# Patient Record
Sex: Male | Born: 1955 | Race: White | Hispanic: No | Marital: Married | Smoking: Never smoker
Health system: Southern US, Community
[De-identification: ages and names within clinical notes are randomized; demographics above are authoritative.]

## PROBLEM LIST (undated history)

## (undated) DIAGNOSIS — E7800 Pure hypercholesterolemia, unspecified: Secondary | ICD-10-CM

## (undated) DIAGNOSIS — D333 Benign neoplasm of cranial nerves: Secondary | ICD-10-CM

## (undated) DIAGNOSIS — G43909 Migraine, unspecified, not intractable, without status migrainosus: Secondary | ICD-10-CM

## (undated) HISTORY — DX: Migraine, unspecified, not intractable, without status migrainosus: G43.909

## (undated) HISTORY — DX: Pure hypercholesterolemia, unspecified: E78.00

---
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
## 2003-12-21 HISTORY — PX: HERNIA REPAIR: SHX51

## 2004-06-08 ENCOUNTER — Ambulatory Visit (HOSPITAL_COMMUNITY): Admission: RE | Admit: 2004-06-08 | Discharge: 2004-06-08 | Payer: Self-pay | Admitting: General Surgery

## 2012-11-24 ENCOUNTER — Encounter: Payer: Self-pay | Admitting: Family Medicine

## 2012-12-15 ENCOUNTER — Encounter: Payer: Self-pay | Admitting: Family Medicine

## 2012-12-15 ENCOUNTER — Telehealth: Payer: Self-pay

## 2012-12-15 ENCOUNTER — Ambulatory Visit (INDEPENDENT_AMBULATORY_CARE_PROVIDER_SITE_OTHER): Payer: 59 | Admitting: Family Medicine

## 2012-12-15 VITALS — BP 100/68 | HR 60 | Temp 98.1°F | Resp 16 | Ht 69.0 in | Wt 168.9 lb

## 2012-12-15 DIAGNOSIS — Z23 Encounter for immunization: Secondary | ICD-10-CM

## 2012-12-15 DIAGNOSIS — N400 Enlarged prostate without lower urinary tract symptoms: Secondary | ICD-10-CM

## 2012-12-15 DIAGNOSIS — Z0000 Encounter for general adult medical examination without abnormal findings: Secondary | ICD-10-CM

## 2012-12-15 LAB — COMPREHENSIVE METABOLIC PANEL
ALT: 54 U/L — ABNORMAL HIGH (ref 0–53)
Albumin: 4.6 g/dL (ref 3.5–5.2)
Alkaline Phosphatase: 85 U/L (ref 39–117)
Glucose, Bld: 85 mg/dL (ref 70–99)
Total Protein: 7 g/dL (ref 6.0–8.3)

## 2012-12-15 LAB — CBC WITH DIFFERENTIAL/PLATELET
Basophils Relative: 1 % (ref 0–1)
Lymphocytes Relative: 30 % (ref 12–46)
MCH: 30 pg (ref 26.0–34.0)
MCHC: 34.6 g/dL (ref 30.0–36.0)
Monocytes Absolute: 0.4 10*3/uL (ref 0.1–1.0)
Monocytes Relative: 9 % (ref 3–12)
Neutro Abs: 2.3 10*3/uL (ref 1.7–7.7)
Platelets: 228 10*3/uL (ref 150–400)
RBC: 5.2 MIL/uL (ref 4.22–5.81)
RDW: 12.8 % (ref 11.5–15.5)
WBC: 4.1 10*3/uL (ref 4.0–10.5)

## 2012-12-15 LAB — POCT URINALYSIS DIPSTICK
Leukocytes, UA: NEGATIVE
Nitrite, UA: NEGATIVE

## 2012-12-15 LAB — LIPID PANEL
Cholesterol: 199 mg/dL (ref 0–200)
HDL: 51 mg/dL (ref >39)
Triglycerides: 155 mg/dL — ABNORMAL HIGH (ref <150)

## 2012-12-15 LAB — TSH: TSH: 0.994 u[IU]/mL (ref 0.350–4.500)

## 2012-12-15 NOTE — Progress Notes (Addendum)
Subjective:    Patient ID: Edward Medina, male    DOB: July 12, 1956, 56 y.o.   MRN: [id number]  HPI  Edward Medina is a delightful 56 yo man, whose wife is also a pt of mine. He presents today for a general check-up without any concerns.  He relates that he perceives his only medical problem to be a h/o mildly elevated cholesterol.  He was tried on a statin prev and took 1 pill and immed made him very confused - got lost while driving - so adamantly against trying statins again and wants to try to manage chol diet and exercise.  He is also taking red yeast rice supp.  His father passed from a massive MI in his mid-50s.  Might be needing surgery on Rt wrist so wants to make sure he is good shape for surgery in case he needs clearance.  Fasting today.  Sees urology regularly about every 6 mos for BPH and they follow his PSA. Had a perforated colon from a fish bone in 1997. Last colonoscopy 07/2010 with Dr. Loreta Ave so UTD. Past Medical History  Diagnosis Date  . Asthma     infant   Past Surgical History  Procedure Date  . Hernia repair 2005   History   Social History  . Marital Status: Married    Spouse Name: N/A    Number of Children: N/A  . Years of Education: N/A   Occupational History  . IT professional    Social History Main Topics  . Smoking status: Never Smoker   . Smokeless tobacco: Not on file  . Alcohol Use: Yes     Comment: drink - beer, wine, and hard liquor  . Drug Use: Not on file  . Sexually Active: Not on file     Comment: number of sex partners in the last 12 months - 1, birth control method  (pill)   Other Topics Concern  . Not on file   Social History Narrative   Exercise indoor roller hockey 1-2 times per week for 2 hours   Family History  Problem Relation Age of Onset  . Heart disease Father   . Stroke Maternal Grandmother   . Heart disease Maternal Grandfather    Current Outpatient Prescriptions on File Prior to Visit  Medication Sig Dispense Refill    . terazosin (HYTRIN) 5 MG capsule Take 5 mg by mouth at bedtime.       Allergies  Allergen Reactions  . Amoxicillin Rash    SEVERE    Review of Systems  Constitutional: Negative.   HENT: Positive for neck pain.   Eyes: Negative.   Respiratory: Negative.   Cardiovascular: Negative.   Gastrointestinal: Positive for constipation.  Genitourinary: Positive for difficulty urinating.  Musculoskeletal: Positive for arthralgias.       Scapholonate ligament tear-RH wrist  Skin: Negative.   Neurological: Negative.   Hematological: Negative.   Psychiatric/Behavioral: Negative.       BP 100/68  Pulse 60  Temp 98.1 F (36.7 C) (Oral)  Resp 16  Ht 5\' 9"  (1.753 m)  Wt 168 lb 14.4 oz (76.613 kg)  BMI 24.94 kg/m2  SpO2 100% Objective:   Physical Exam  Constitutional: He is oriented to person, place, and time. He appears well-developed and well-nourished. No distress.  HENT:  Head: Normocephalic and atraumatic.  Right Ear: Tympanic membrane, external ear and ear canal normal.  Left Ear: Tympanic membrane, external ear and ear canal normal.  Nose: Nose normal.  Mouth/Throat: Uvula is midline, oropharynx is clear and moist and mucous membranes are normal. No oropharyngeal exudate.  Eyes: Conjunctivae normal are normal. Right eye exhibits no discharge. Left eye exhibits no discharge. No scleral icterus.  Neck: Normal range of motion. Neck supple. No thyromegaly present.  Cardiovascular: Normal rate, regular rhythm, normal heart sounds and intact distal pulses.   Pulmonary/Chest: Effort normal and breath sounds normal. No respiratory distress.  Abdominal: Soft. Bowel sounds are normal. He exhibits no distension and no mass. There is no tenderness. There is no rebound and no guarding.  Musculoskeletal: He exhibits no edema.  Lymphadenopathy:    He has no cervical adenopathy.  Neurological: He is alert and oriented to person, place, and time. He has normal reflexes. No cranial nerve  deficit. He exhibits normal muscle tone.  Skin: Skin is warm and dry. No rash noted. He is not diaphoretic. No erythema.  Psychiatric: He has a normal mood and affect. His behavior is normal.      Results for orders placed in visit on 12/15/12  POCT URINALYSIS DIPSTICK      Component Value Range   Color, UA yellow     Clarity, UA clear     Glucose, UA neg     Bilirubin, UA neg     Ketones, UA neg     Spec Grav, UA 1.020     Blood, UA neg     pH, UA 6.0     Protein, UA neg     Urobilinogen, UA 0.2     Nitrite, UA neg     Leukocytes, UA Negative      Assessment & Plan:   1. Routine general medical examination at a health care facility  POCT urinalysis dipstick, Lipid panel - will need to check [place] when results return to assess lipid goal with risk factors of age and +FHx - advised mediterranean diet , Comprehensive metabolic panel, CBC with Differential, TSH, Last colonoscopy 07/2010 with Dr. Loreta Ave so UTD  2. Need for prophylactic vaccination and inoculation against influenza  Flu vaccine greater than or equal to 3yo preservative free IM  3. Benign prostatic hypertrophy  Followed by urology

## 2012-12-15 NOTE — Telephone Encounter (Signed)
PT WOULD LIKE TO LET DR.SHAW KNOW THAT THE DATE OF LAST HIS COLONOSCOPY WAS 08/13/10. BEST# [phone]

## 2012-12-18 ENCOUNTER — Encounter: Payer: Self-pay | Admitting: Family Medicine

## 2012-12-18 NOTE — Telephone Encounter (Signed)
Pt states that he would like to know his EKG results. Please advise, Best# [phone]

## 2012-12-19 NOTE — Telephone Encounter (Signed)
His EKG was normal.  I'd be happy to send him a copy of it but it was done on paper next door at [address] so has been sent off to be scanned in - but is not yet scanned in - could take several wks.  I cancelled the colonoscopy referral that I placed.  Also, because I had already signed off on his blood work before his my chart was created, I don't think I can release it earlier to the web than the automatic 10d - at least I can't figure out how to.  We'd be happy to mail him a copy prior if he would like.

## 2012-12-19 NOTE — Telephone Encounter (Signed)
Called pt and notified him of EKG results and cancellation of colonoscopy. Mailed pt a copy of his labs, but pt stated he does not need a copy of his EKG. Pt would like his chart to be updated w/his colonoscopy record if possible. Abstracted new date for last colonoscopy.

## 2012-12-19 NOTE — Addendum Note (Signed)
Addended by: [name] on: 12/19/2012 09:07 AM   Modules accepted: Orders

## 2013-02-06 ENCOUNTER — Encounter: Payer: Self-pay | Admitting: Family Medicine

## 2013-10-25 ENCOUNTER — Other Ambulatory Visit: Payer: Self-pay

## 2013-11-23 ENCOUNTER — Encounter: Payer: Self-pay | Admitting: Family Medicine

## 2013-11-23 ENCOUNTER — Ambulatory Visit (INDEPENDENT_AMBULATORY_CARE_PROVIDER_SITE_OTHER): Payer: 59 | Admitting: Family Medicine

## 2013-11-23 VITALS — BP 120/64 | HR 66 | Temp 97.6°F | Resp 16 | Ht 69.0 in | Wt 164.0 lb

## 2013-11-23 DIAGNOSIS — Z8249 Family history of ischemic heart disease and other diseases of the circulatory system: Secondary | ICD-10-CM

## 2013-11-23 DIAGNOSIS — M8580 Other specified disorders of bone density and structure, unspecified site: Secondary | ICD-10-CM

## 2013-11-23 DIAGNOSIS — Z0000 Encounter for general adult medical examination without abnormal findings: Secondary | ICD-10-CM

## 2013-11-23 DIAGNOSIS — N138 Other obstructive and reflux uropathy: Secondary | ICD-10-CM

## 2013-11-23 DIAGNOSIS — K5909 Other constipation: Secondary | ICD-10-CM

## 2013-11-23 LAB — CBC WITH DIFFERENTIAL/PLATELET
Basophils Relative: 1 % (ref 0–1)
Eosinophils Absolute: 0.3 10*3/uL (ref 0.0–0.7)
Eosinophils Relative: 5 % (ref 0–5)
Hemoglobin: 16.2 g/dL (ref 13.0–17.0)
Lymphocytes Relative: 27 % (ref 12–46)
Lymphs Abs: 1.5 10*3/uL (ref 0.7–4.0)
MCH: 30.1 pg (ref 26.0–34.0)
MCV: 84.2 fL (ref 78.0–100.0)
Neutro Abs: 3.1 10*3/uL (ref 1.7–7.7)
Platelets: 267 10*3/uL (ref 150–400)
WBC: 5.3 10*3/uL (ref 4.0–10.5)

## 2013-11-23 LAB — LIPID PANEL
Cholesterol: 203 mg/dL — ABNORMAL HIGH (ref 0–200)
HDL: 52 mg/dL (ref >39)
LDL Cholesterol: 133 mg/dL — ABNORMAL HIGH (ref 0–99)
Total CHOL/HDL Ratio: 3.9 Ratio
VLDL: 18 mg/dL (ref 0–40)

## 2013-11-23 LAB — COMPREHENSIVE METABOLIC PANEL
ALT: 77 U/L — ABNORMAL HIGH (ref 0–53)
Albumin: 4.6 g/dL (ref 3.5–5.2)
Alkaline Phosphatase: 134 U/L — ABNORMAL HIGH (ref 39–117)
BUN: 17 mg/dL (ref 6–23)
Calcium: 10 mg/dL (ref 8.4–10.5)
Creat: 1.01 mg/dL (ref 0.50–1.35)
Sodium: 140 mEq/L (ref 135–145)

## 2013-11-23 NOTE — Progress Notes (Signed)
   Subjective:    Patient ID: Edward Medina, male    DOB: [birth year], 57 y.o.   MRN: [id number]  HPI    Review of Systems  HENT: Positive for postnasal drip.   Gastrointestinal: Positive for constipation.  Genitourinary: Positive for difficulty urinating.       Objective:   Physical Exam        Assessment & Plan:

## 2013-11-23 NOTE — Patient Instructions (Signed)

## 2013-11-23 NOTE — Progress Notes (Signed)
Subjective:    Patient ID: Edward Medina, male    DOB: 1956-09-04, 57 y.o.   MRN: [id number] Chief Complaint  Patient presents with  . Annual Exam    HPI  Edward Medina is a delightful 57 yo man, whose wife is also a pt of mine. He presents today for a general check-up without any concerns. He relates that he perceives his only medical problem to be a h/o mildly elevated cholesterol. He was tried on a statin prev and took 1 pill and immed made him very confused - got lost while driving - so adamantly against trying statins again and wants to try to manage chol diet and exercise. He is also taking red yeast rice supp. His father passed from a massive MI in his mid-50s.   Might be needing surgery on Rt wrist so wants to make sure he is good shape for surgery in case he needs clearance. Saw Dr. Amanda Medina Jan 2013 - told when ready for surgery he would know it - will loose 40-50% motion in all directions so he is ok putting up the wrist, still playing roller hockey.  Fasting today.   Sees urology Edward Medina regularly about every 6 mos for BPH and they follow his PSA. Still managing w/ terazosin. Sxs always worst in the morning.  Does have some terazosin side effects so unsure about doubling dose.  Had a perforated colon from a fish bone in 1997. Last colonoscopy 07/2010 with Dr. Loreta Medina so UTD.  TDaP 2010. Got flu shot this year.  08/2011 bone density [facility]xa at [facility]. Takes multivitamin daily but no sig sources of calcium in the diet.  Has been having some chronic constipation. Drinks plenty of water, fiber, exercise. Occ uses Miralax which works well but slowly. Has used enemas in past as well as glycerin suppositories.  Has little stool balls, causes lower back pain.  Only needs to use drysol once every 60d to control symptoms.   Past Medical History  Diagnosis Date  . Asthma     infant   Past Surgical History  Procedure Laterality Date  . Hernia repair  2005   Current Outpatient Prescriptions  on File Prior to Visit  Medication Sig Dispense Refill  . aspirin 81 MG tablet Take 81 mg by mouth daily.      . fish oil-omega-3 fatty acids 1000 MG capsule Take 2 g by mouth daily.      . Multiple Vitamin (MULTI VITAMIN MENS PO) Take by mouth daily.      . Red Yeast Rice Extract (RED YEAST RICE PO) Take by mouth daily.      . Saw Palmetto, Serenoa repens, (SAW PALMETTO PO) Take by mouth daily.      Marland Kitchen terazosin (HYTRIN) 5 MG capsule Take 5 mg by mouth at bedtime.       No current facility-administered medications on file prior to visit.   Allergies  Allergen Reactions  . Amoxicillin Rash    SEVERE   Family History  Problem Relation Age of Onset  . Heart disease Father   . Stroke Maternal Grandmother   . Heart disease Maternal Grandfather    History   Social History  . Marital Status: Married    Spouse Name: N/A    Number of Children: N/A  . Years of Education: N/A   Occupational History  . IT professional    Social History Main Topics  . Smoking status: Never Smoker   . Smokeless tobacco: None  .  Alcohol Use: Yes     Comment: drink - beer, wine, and hard liquor  . Drug Use: None  . Sexual Activity: None     Comment: number of sex partners in the last 12 months - 1, birth control method  (pill)   Other Topics Concern  . None   Social History Narrative   Exercise indoor roller hockey 1-2 times per week for 2 hours    Review of Systems See above nursing note for complete list    BP 120/64  Pulse 66  Temp(Src) 97.6 F (36.4 C) (Oral)  Resp 16  Ht 5\' 9"  (1.753 m)  Wt 164 lb (74.39 kg)  BMI 24.21 kg/m2  SpO2 99% Objective:   Physical Exam  Constitutional: He is oriented to person, place, and time. He appears well-developed and well-nourished. No distress.  HENT:  Head: Normocephalic and atraumatic.  Right Ear: External ear and ear canal normal.  Left Ear: Tympanic membrane, external ear and ear canal normal.  Nose: Nose normal.  Mouth/Throat: Uvula is  midline, oropharynx is clear and moist and mucous membranes are normal. No oropharyngeal exudate.  Right ear w/ cerumen impaction  Eyes: Conjunctivae are normal. Right eye exhibits no discharge. Left eye exhibits no discharge. No scleral icterus.  Neck: Normal range of motion. Neck supple. No thyromegaly present.  Cardiovascular: Normal rate, regular rhythm, normal heart sounds and intact distal pulses.   Pulmonary/Chest: Effort normal and breath sounds normal. No respiratory distress.  Abdominal: Soft. Bowel sounds are normal. He exhibits no distension and no mass. There is no tenderness. There is no rebound and no guarding.  Musculoskeletal: He exhibits no edema.  Lymphadenopathy:    He has no cervical adenopathy.  Neurological: He is alert and oriented to person, place, and time. He has normal reflexes. No cranial nerve deficit. He exhibits normal muscle tone.  Skin: Skin is warm and dry. No rash noted. He is not diaphoretic. No erythema.  Psychiatric: He has a normal mood and affect. His behavior is normal.          Assessment & Plan:   Routine general medical examination at a health care facility - Plan: CBC with Differential, Comprehensive metabolic panel, Lipid panel, TSH  Osteopenia - Plan: TSH, Vit D  25 hydroxy (rtn osteoporosis monitoring)  Chronic constipation  Family history of coronary artery disease - Plan: Lipid panel  BPH (benign prostatic hypertrophy) with urinary obstruction  Meds ordered this encounter  Medications  . aluminum chloride (DRYSOL) 20 % external solution    Sig: Apply topically at bedtime.    Edward Sorenson, MD MPH

## 2013-11-24 LAB — VITAMIN D 25 HYDROXY (VIT D DEFICIENCY, FRACTURES): Vit D, 25-Hydroxy: 47 ng/mL (ref 30–89)

## 2014-01-25 ENCOUNTER — Telehealth: Payer: Self-pay | Admitting: Family Medicine

## 2014-01-25 NOTE — Telephone Encounter (Signed)
LMOM to CB. 

## 2014-01-25 NOTE — Telephone Encounter (Signed)
Message copied by Chinita Pester on Fri Jan 25, 2014  2:31 PM ------      Message from: Delman Cheadle      Created: Sat Jan 19, 2014  2:14 PM       Please have pt schedule a f/u appt in about 2 months to have elevated liver enzymes rechecked.  Thanks.      Dr. Brigitte Pulse ------

## 2014-01-31 ENCOUNTER — Telehealth: Payer: Self-pay | Admitting: Family Medicine

## 2014-01-31 NOTE — Telephone Encounter (Signed)
LMOM to CB. 

## 2014-01-31 NOTE — Telephone Encounter (Signed)
Message copied by Chinita Pester on Thu Jan 31, 2014 12:21 PM ------      Message from: [place], EVA      Created: Sat Jan 19, 2014  2:14 PM       Please have pt schedule a f/u appt in about 2 months to have elevated liver enzymes rechecked.  Thanks.      Dr. Brigitte Pulse ------

## 2014-03-07 ENCOUNTER — Encounter: Payer: Self-pay | Admitting: Family Medicine

## 2014-03-10 ENCOUNTER — Ambulatory Visit: Payer: 59

## 2014-03-10 ENCOUNTER — Ambulatory Visit: Payer: 59 | Admitting: Emergency Medicine

## 2014-03-10 VITALS — BP 120/72 | HR 70 | Temp 97.9°F | Resp 16 | Ht 68.75 in | Wt 165.0 lb

## 2014-03-10 DIAGNOSIS — S20219A Contusion of unspecified front wall of thorax, initial encounter: Secondary | ICD-10-CM

## 2014-03-10 NOTE — Progress Notes (Signed)
[facility] [address], [place] [address] [phone]  Date:  03/10/2014   Name:  Edward Medina   DOB:  05-19-1956   MRN:  [id number]  PCP:  Delman Cheadle, MD    Chief Complaint: Chest Pain   History of Present Illness:  Edward Medina is a 58 y.o. very pleasant male patient who presents with the following:  Injured Wednesday playing roller hockey and has pain in left lateral chest wall that is associated with moderate pain and is pleuritic in nature.  No hemoptysis. No shortness of breath.  No nausea or vomiting.  No hematuria.  No improvement with over the counter medications or other home remedies. Denies other complaint or health concern today.   Patient Active Problem List   Diagnosis Date Noted  . Osteopenia 11/23/2013  . Family history of coronary artery disease 11/23/2013  . Benign prostatic hypertrophy 12/15/2012    Past Medical History  Diagnosis Date  . Asthma     infant  . Hypoglycemia     Past Surgical History  Procedure Laterality Date  . Hernia repair  2005    History  Substance Use Topics  . Smoking status: Never Smoker   . Smokeless tobacco: Not on file  . Alcohol Use: Yes     Comment: drink - beer, wine, and hard liquor    Family History  Problem Relation Age of Onset  . Heart disease Father   . Hyperlipidemia Father   . Stroke Maternal Grandmother   . Heart disease Maternal Grandfather     Allergies  Allergen Reactions  . Amoxicillin Rash    SEVERE    Medication list has been reviewed and updated.  Current Outpatient Prescriptions on File Prior to Visit  Medication Sig Dispense Refill  . aluminum chloride (DRYSOL) 20 % external solution Apply topically at bedtime.      Marland Kitchen aspirin 81 MG tablet Take 81 mg by mouth daily.      . fish oil-omega-3 fatty acids 1000 MG capsule Take 2 g by mouth daily.      . Multiple Vitamin (MULTI VITAMIN MENS PO) Take by mouth daily.      . Red Yeast Rice Extract (RED YEAST RICE  PO) Take by mouth daily.      . Saw Palmetto, Serenoa repens, (SAW PALMETTO PO) Take by mouth daily.      Marland Kitchen terazosin (HYTRIN) 5 MG capsule Take 5 mg by mouth at bedtime.       No current facility-administered medications on file prior to visit.    Review of Systems:  As per HPI, otherwise negative.    Physical Examination: Filed Vitals:   03/10/14 1417  BP: 120/72  Pulse: 70  Temp: 97.9 F (36.6 C)  Resp: 16   Filed Vitals:   03/10/14 1417  Height: 5' 8.75" (1.746 m)  Weight: 165 lb (74.844 kg)   Body mass index is 24.55 kg/(m^2). Ideal Body Weight: Weight in (lb) to have BMI = 25: 167.7  GEN: WDWN, NAD, Non-toxic, A & O x 3 HEENT: Atraumatic, Normocephalic. Neck supple. No masses, No LAD. Ears and Nose: No external deformity. CV: RRR, No M/G/R. No JVD. No thrill. No extra heart sounds. Chest:  Tender left mid chest wall PULM: CTA B, no wheezes, crackles, rhonchi. No retractions. No resp. distress. No accessory muscle use. ABD: S, NT, ND, +BS. No rebound. No HSM. EXTR: No c/c/e NEURO Normal gait.  PSYCH: Normally interactive. Conversant.  Not depressed or anxious appearing.  Calm demeanor.    Assessment and Plan: Chest contusion Declined pain meds says OTC NSAID are ok   Signed,  Ellison Carwin, MD   [facility] reading (PRIMARY) by  Dr. Ouida Sills.  Negative chest.

## 2014-03-10 NOTE — Patient Instructions (Signed)
Rib Fracture  A rib fracture is a break or crack in one of the bones of the ribs. The ribs are a group of long, curved bones that wrap around your chest and attach to your spine. They protect your lungs and other organs in the chest cavity. A broken or cracked rib is often painful, but most do not cause other problems. Most rib fractures heal on their own over time. However, rib fractures can be more serious if multiple ribs are broken or if broken ribs move out of place and push against other structures.  CAUSES   · A direct blow to the chest. For example, this could happen during contact sports, a car accident, or a fall against a hard object.  · Repetitive movements with high force, such as pitching a baseball or having severe coughing spells.  SYMPTOMS   · Pain when you breathe in or cough.  · Pain when someone presses on the injured area.  DIAGNOSIS   Your caregiver will perform a physical exam. Various imaging tests may be ordered to confirm the diagnosis and to look for related injuries. These tests may include a chest X-ray, computed tomography (CT), magnetic resonance imaging (MRI), or a bone scan.  TREATMENT   Rib fractures usually heal on their own in 1 3 months. The longer healing period is often associated with a continued cough or other aggravating activities. During the healing period, pain control is very important. Medication is usually given to control pain. Hospitalization or surgery may be needed for more severe injuries, such as those in which multiple ribs are broken or the ribs have moved out of place.   HOME CARE INSTRUCTIONS   · Avoid strenuous activity and any activities or movements that cause pain. Be careful during activities and avoid bumping the injured rib.  · Gradually increase activity as directed by your caregiver.  · Only take over-the-counter or prescription medications as directed by your caregiver. Do not take other medications without asking your caregiver first.  · Apply ice  to the injured area for the first 1 2 days after you have been treated or as directed by your caregiver. Applying ice helps to reduce inflammation and pain.  · Put ice in a plastic bag.  · Place a towel between your skin and the bag.    · Leave the ice on for 15 20 minutes at a time, every 2 hours while you are awake.  · Perform deep breathing as directed by your caregiver. This will help prevent pneumonia, which is a common complication of a broken rib. Your caregiver may instruct you to:  · Take deep breaths several times a day.  · Try to cough several times a day, holding a pillow against the injured area.  · Use a device called an incentive spirometer to practice deep breathing several times a day.  · Drink enough fluids to keep your urine clear or pale yellow. This will help you avoid constipation.    · Do not wear a rib belt or binder. These restrict breathing, which can lead to pneumonia.    SEEK IMMEDIATE MEDICAL CARE IF:   · You have a fever.    · You have difficulty breathing or shortness of breath.    · You develop a continual cough, or you cough up thick or bloody sputum.  · You feel sick to your stomach (nausea), throw up (vomit), or have abdominal pain.    · You have worsening pain not controlled with medications.      MAKE SURE YOU:  · Understand these instructions.  · Will watch your condition.  · Will get help right away if you are not doing well or get worse.  Document Released: 12/06/2005 Document Revised: 08/08/2013 Document Reviewed: 02/07/2013  [employer]® Patient Information ©2014 [employer].

## 2014-10-18 ENCOUNTER — Encounter: Payer: Self-pay | Admitting: Family Medicine

## 2014-10-18 ENCOUNTER — Ambulatory Visit (INDEPENDENT_AMBULATORY_CARE_PROVIDER_SITE_OTHER): Payer: 59 | Admitting: Family Medicine

## 2014-10-18 VITALS — BP 102/66 | HR 68 | Temp 98.3°F | Resp 16 | Ht 68.5 in | Wt 166.2 lb

## 2014-10-18 DIAGNOSIS — Z23 Encounter for immunization: Secondary | ICD-10-CM

## 2014-10-18 DIAGNOSIS — M8580 Other specified disorders of bone density and structure, unspecified site: Secondary | ICD-10-CM

## 2014-10-18 DIAGNOSIS — Z13220 Encounter for screening for lipoid disorders: Secondary | ICD-10-CM

## 2014-10-18 DIAGNOSIS — Z0000 Encounter for general adult medical examination without abnormal findings: Secondary | ICD-10-CM

## 2014-10-18 DIAGNOSIS — Z8249 Family history of ischemic heart disease and other diseases of the circulatory system: Secondary | ICD-10-CM

## 2014-10-18 DIAGNOSIS — E785 Hyperlipidemia, unspecified: Secondary | ICD-10-CM

## 2014-10-18 LAB — CBC WITH DIFFERENTIAL/PLATELET
BASOS ABS: 0 10*3/uL (ref 0.0–0.1)
BASOS PCT: 1 % (ref 0–1)
EOS ABS: 0.2 10*3/uL (ref 0.0–0.7)
EOS PCT: 5 % (ref 0–5)
HEMATOCRIT: 45.3 % (ref 39.0–52.0)
HEMOGLOBIN: 15.8 g/dL (ref 13.0–17.0)
LYMPHS ABS: 1.3 10*3/uL (ref 0.7–4.0)
Lymphocytes Relative: 33 % (ref 12–46)
MCH: 29.8 pg (ref 26.0–34.0)
MCHC: 34.9 g/dL (ref 30.0–36.0)
MCV: 85.5 fL (ref 78.0–100.0)
MONO ABS: 0.3 10*3/uL (ref 0.1–1.0)
MONOS PCT: 8 % (ref 3–12)
Neutro Abs: 2.1 10*3/uL (ref 1.7–7.7)
Neutrophils Relative %: 53 % (ref 43–77)
Platelets: 173 10*3/uL (ref 150–400)
RBC: 5.3 MIL/uL (ref 4.22–5.81)
RDW: 13.6 % (ref 11.5–15.5)
WBC: 3.9 10*3/uL — AB (ref 4.0–10.5)

## 2014-10-18 NOTE — Progress Notes (Signed)
Subjective:    Patient ID: Edward Medina, male    DOB: [birth year], 58 y.o.   MRN: [id number] This chart was scribed for Delman Cheadle, MD by Marti Sleigh, Medical Scribe. This patient was seen in Room 23 and the patient's care was started a 4:16 PM.  Chief Complaint  Patient presents with  . Annual Exam    HPI Past Medical History  Diagnosis Date  . Asthma     infant  . Hypoglycemia    Allergies  Allergen Reactions  . Amoxicillin Rash    SEVERE   Current Outpatient Prescriptions on File Prior to Visit  Medication Sig Dispense Refill  . aluminum chloride (DRYSOL) 20 % external solution Apply topically at bedtime.      Marland Kitchen aspirin 81 MG tablet Take 81 mg by mouth daily.      . fish oil-omega-3 fatty acids 1000 MG capsule Take 2 g by mouth daily.      . Multiple Vitamin (MULTI VITAMIN MENS PO) Take by mouth daily.      . Red Yeast Rice Extract (RED YEAST RICE PO) Take by mouth daily.      . Saw Palmetto, Serenoa repens, (SAW PALMETTO PO) Take by mouth daily.      Marland Kitchen terazosin (HYTRIN) 5 MG capsule Take 5 mg by mouth at bedtime.       No current facility-administered medications on file prior to visit.   HPI Comments: ISAIR INABINET is a 58 y.o. male with a past hx of constipation and BPH who presents to [facility] for an annual exam. 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 Pt's father and uncle both had HLD and MIs at 57 years old. Pt's father passed away at 65 from CAD.  Pt has had progressively increasing LFT's that are presumed to be a result of fatty liver, but no liver imaging has been done prior. Pt is intolerant of statins, but pt manages his cholesterol with omega 3s and exercising regulary.   Pt sees his urologist for his BPH, who also follows his PSA.  Pt had a dexa bone scan done at [facility] on 08/2011, which showed osteopenia.  Pt had a colonoscopy 2011, performed by Dr. Collene Mares.  Pt had a T-dap done in 2010.   Past Surgical History  Procedure Laterality  Date  . Hernia repair  2005   Family History  Problem Relation Age of Onset  . Heart disease Father   . Hyperlipidemia Father   . Stroke Maternal Grandmother   . Heart disease Maternal Grandfather    History   Social History  . Marital Status: Married    Spouse Name: N/A    Number of Children: N/A  . Years of Education: N/A   Occupational History  . IT professional    Social History Main Topics  . Smoking status: Never Smoker   . Smokeless tobacco: None  . Alcohol Use: Yes     Comment: drink - beer, wine, and hard liquor  . Drug Use: None  . Sexual Activity: None     Comment: number of sex partners in the last 81 months - 74, birth control method  (pill)   Other Topics Concern  . None   Social History Narrative   Exercise indoor roller hockey 1-2 times per week for 2 hours   College   Married   Physicist, medical           Review of Systems  Constitutional: Negative for fever and chills.  Respiratory: Negative for shortness of breath.   Cardiovascular: Negative for chest pain and palpitations.  Gastrointestinal: Positive for constipation. Negative for nausea and vomiting.  Genitourinary: Positive for difficulty urinating.  Musculoskeletal: Positive for arthralgias. Negative for myalgias.  All other systems reviewed and are negative.      Objective:  BP 102/66  Pulse 68  Temp(Src) 98.3 F (36.8 C) (Oral)  Resp 16  Ht 5' 8.5" (1.74 m)  Wt 166 lb 3.2 oz (75.388 kg)  BMI 24.90 kg/m2  SpO2 94%  Physical Exam  Nursing note and vitals reviewed. Constitutional: He is oriented to person, place, and time. He appears well-developed and well-nourished.  HENT:  Head: Normocephalic and atraumatic.  Left Ear: External ear normal.  Right ear significantly blocked by cerumen.  Eyes: Pupils are equal, round, and reactive to light.  Neck: No JVD present. No thyromegaly present.  Cardiovascular: Normal rate, regular rhythm and normal heart sounds.   No murmur  heard. Pulmonary/Chest: Effort normal and breath sounds normal. No respiratory distress. He has no wheezes.  Abdominal: Soft. Bowel sounds are normal. He exhibits no mass. There is no tenderness.  Lymphadenopathy:    He has no cervical adenopathy.  Neurological: He is alert and oriented to person, place, and time.  Skin: Skin is warm and dry.  Psychiatric: He has a normal mood and affect. His behavior is normal.       Assessment & Plan:  Need for prophylactic vaccination and inoculation against influenza - Plan: Flu Vaccine QUAD 36+ mos IM  Osteopenia - last DEXA 3 yrs prev - rec repeat exam for recheck at [facility]  Routine general medical examination at a health care facility - Plan: COMPLETE METABOLIC PANEL WITH GFR, CBC with Differential, Lipid panel   Elev LFTs - LFTs normal this yr as cholesterol improved - very reassuring, cont monitoring yearly.  Screening for cholesterol level - Plan: Lipid panel Due to + FHx of early CAD in father, will refer pt to cardiology for stress test. Also, pt would like to avoid statin unless it would help significantly so rec pt get compete detailed lipid panel prior to cardiology appt so he can review results at that time.  Correct tube for this was not drawn this morning when pt was here for his fasting labs so he will RTC for lab only draw in future  I personally performed the services described in this documentation, which was scribed in my presence. The recorded information has been reviewed and considered, and addended by me as needed.  Delman Cheadle, MD MPH

## 2014-10-18 NOTE — Progress Notes (Deleted)
   Subjective:    Patient ID: Edward Medina, male    DOB: 03-Mar-1956, 58 y.o.   MRN: [id number]  HPI    Review of Systems  Constitutional: Negative.   HENT: Negative.   Eyes: Negative.   Respiratory: Negative.   Cardiovascular: Negative.   Gastrointestinal: Positive for constipation.  Endocrine: Negative.   Genitourinary: Positive for difficulty urinating.  Musculoskeletal: Positive for arthralgias.  Skin: Negative.   Allergic/Immunologic: Negative.   Neurological: Negative.   Hematological: Negative.   Psychiatric/Behavioral: Negative.        Objective:   Physical Exam        Assessment & Plan:

## 2014-10-19 LAB — COMPLETE METABOLIC PANEL WITH GFR
ALT: 40 U/L (ref 0–53)
AST: 28 U/L (ref 0–37)
Albumin: 4.2 g/dL (ref 3.5–5.2)
Alkaline Phosphatase: 63 U/L (ref 39–117)
BUN: 17 mg/dL (ref 6–23)
CHLORIDE: 103 meq/L (ref 96–112)
CO2: 28 meq/L (ref 19–32)
CREATININE: 0.85 mg/dL (ref 0.50–1.35)
Calcium: 9.6 mg/dL (ref 8.4–10.5)
GLUCOSE: 85 mg/dL (ref 70–99)
Potassium: 4.3 mEq/L (ref 3.5–5.3)
SODIUM: 139 meq/L (ref 135–145)
Total Bilirubin: 0.9 mg/dL (ref 0.2–1.2)
Total Protein: 6.8 g/dL (ref 6.0–8.3)

## 2014-10-19 LAB — LIPID PANEL
Cholesterol: 183 mg/dL (ref 0–200)
HDL: 53 mg/dL (ref >39)
LDL Cholesterol: 114 mg/dL — ABNORMAL HIGH (ref 0–99)
Total CHOL/HDL Ratio: 3.5 Ratio
Triglycerides: 78 mg/dL (ref <150)
VLDL: 16 mg/dL (ref 0–40)

## 2014-10-29 ENCOUNTER — Encounter: Payer: Self-pay | Admitting: Family Medicine

## 2014-11-28 ENCOUNTER — Ambulatory Visit (INDEPENDENT_AMBULATORY_CARE_PROVIDER_SITE_OTHER): Payer: 59 | Admitting: Cardiology

## 2014-11-28 ENCOUNTER — Encounter: Payer: Self-pay | Admitting: Cardiology

## 2014-11-28 VITALS — BP 138/72 | HR 64 | Ht 68.5 in | Wt 170.2 lb

## 2014-11-28 DIAGNOSIS — E785 Hyperlipidemia, unspecified: Secondary | ICD-10-CM

## 2014-11-28 DIAGNOSIS — Z8249 Family history of ischemic heart disease and other diseases of the circulatory system: Secondary | ICD-10-CM

## 2014-11-28 DIAGNOSIS — R0609 Other forms of dyspnea: Secondary | ICD-10-CM

## 2014-11-28 DIAGNOSIS — R0600 Dyspnea, unspecified: Secondary | ICD-10-CM

## 2014-11-28 NOTE — Patient Instructions (Addendum)
**Note De-Identified Baleria Wyman Obfuscation** Your physician recommends that you continue on your current medications as directed. Please refer to the Current Medication list given to you today.  Your physician has requested that you have an exercise tolerance test. For further information please visit [email or web address]. Please also follow instruction sheet, as given.  Dr Meda Coffee recommends that you have a calcium score, we will arrange to be done by her on same day as Treadmill.  Your physician wants you to follow-up in: 1 year. You will receive a reminder letter in the mail two months in advance. If you don't receive a letter, please call our office to schedule the follow-up appointment.

## 2014-11-28 NOTE — Progress Notes (Signed)
Patient ID: RAN TULLIS, male   DOB: 05-13-1956, 58 y.o.   MRN: [id number]    Patient Name: Edward Medina Date of Encounter: 11/28/2014  Primary Care Provider:  Delman Cheadle, MD Primary Cardiologist: Dorothy Spark   Problem List   Past Medical History  Diagnosis Date  . Asthma     infant  . Hypoglycemia    Past Surgical History  Procedure Laterality Date  . Hernia repair  2005    Allergies  Allergies  Allergen Reactions  . Amoxicillin Rash    SEVERE    HPI  58 year old gentleman with past medical history of hyperlipidemia and family history of premature coronary artery disease. Patient's main concern is that his father died of massive heart attack at age of 56. The patient himself was diagnosed with hyperlipidemia few years ago and for a while taking simvastatin that lead to confusion where he lost his way on her way to work. He is currently taking Fish all and red yeast rice without any side effects. The patient used to have active job however now he works from home and states that his activity level has significantly decreased. He plays floor hockey every Wednesday. He would occasionally feel short of breath especially after a long period of inactivity. He denies any palpitations or syncope. No chest pain, lower extremity edema, claudications or orthopnea. He sticks to healthy diet most of the time.  Home Medications  Prior to Admission medications   Medication Sig Start Date End Date Taking? Authorizing Provider  aluminum chloride (DRYSOL) 20 % external solution Apply topically at bedtime.   Yes Historical Provider, MD  aspirin 81 MG tablet Take 81 mg by mouth daily.   Yes Historical Provider, MD  fish oil-omega-3 fatty acids 1000 MG capsule Take 2 g by mouth 3 (three) times a week.    Yes Historical Provider, MD  Multiple Vitamin (MULTI VITAMIN MENS PO) Take by mouth daily.   Yes Historical Provider, MD  Red Yeast Rice Extract (RED YEAST RICE PO) Take by mouth  daily.   Yes Historical Provider, MD  Saw Palmetto, Serenoa repens, (SAW PALMETTO PO) Take by mouth daily.   Yes Historical Provider, MD  terazosin (HYTRIN) 5 MG capsule Take 5 mg by mouth at bedtime.   Yes Historical Provider, MD    Family History  Family History  Problem Relation Age of Onset  . Heart disease Father   . Hyperlipidemia Father   . Stroke Maternal Grandmother   . Heart disease Maternal Grandfather     Social History  History   Social History  . Marital Status: Married    Spouse Name: N/A    Number of Children: N/A  . Years of Education: N/A   Occupational History  . IT professional    Social History Main Topics  . Smoking status: Never Smoker   . Smokeless tobacco: Not on file  . Alcohol Use: Yes     Comment: drink - beer, wine, and hard liquor  . Drug Use: Not on file  . Sexual Activity: Not on file     Comment: number of sex partners in the last 43 months - 51, birth control method  (pill)   Other Topics Concern  . Not on file   Social History Narrative   Exercise indoor roller hockey 1-2 times per week for 2 hours   College   Married   Physicist, medical           Review  of Systems, as per HPI, otherwise negative General:  No chills, fever, night sweats or weight changes.  Cardiovascular:  No chest pain, dyspnea on exertion, edema, orthopnea, palpitations, paroxysmal nocturnal dyspnea. Dermatological: No rash, lesions/masses Respiratory: No cough, dyspnea Urologic: No hematuria, dysuria Abdominal:   No nausea, vomiting, diarrhea, bright red blood per rectum, melena, or hematemesis Neurologic:  No visual changes, wkns, changes in mental status. All other systems reviewed and are otherwise negative except as noted above.  Physical Exam  Blood pressure 138/72, pulse 64, height 5' 8.5" (1.74 m), weight 170 lb 3.2 oz (77.202 kg).  General: Pleasant, NAD Psych: Normal affect. Neuro: Alert and oriented X 3. Moves all extremities  spontaneously. HEENT: Normal  Neck: Supple without bruits or JVD. Lungs:  Resp regular and unlabored, CTA. Heart: RRR no s3, s4, or murmurs. Abdomen: Soft, non-tender, non-distended, BS + x 4.  Extremities: No clubbing, cyanosis or edema. DP/PT/Radials 2+ and equal bilaterally.  Labs:  No results for input(s): CKTOTAL, CKMB, TROPONINI in the last 72 hours. Lab Results  Component Value Date   WBC 3.9* 10/18/2014   HGB 15.8 10/18/2014   HCT 45.3 10/18/2014   MCV 85.5 10/18/2014   PLT 173 10/18/2014    No results found for: DDIMER Invalid input(s): POCBNP    Component Value Date/Time   NA 139 10/18/2014 1648   K 4.3 10/18/2014 1648   CL 103 10/18/2014 1648   CO2 28 10/18/2014 1648   GLUCOSE 85 10/18/2014 1648   BUN 17 10/18/2014 1648   CREATININE 0.85 10/18/2014 1648   CALCIUM 9.6 10/18/2014 1648   PROT 6.8 10/18/2014 1648   ALBUMIN 4.2 10/18/2014 1648   AST 28 10/18/2014 1648   ALT 40 10/18/2014 1648   ALKPHOS 63 10/18/2014 1648   BILITOT 0.9 10/18/2014 1648   GFRNONAA >89 10/18/2014 1648   GFRAA >89 10/18/2014 1648   Lab Results  Component Value Date   CHOL 183 10/18/2014   HDL 53 10/18/2014   LDLCALC 114* 10/18/2014   TRIG 78 10/18/2014   Accessory Clinical Findings  Echocardiogram - none  ECG - NSR, normal ECG.   Assessment & Plan  A very pleasant 58 year old gentleman  1. Preventive cardiology with significant FH of premature CAD, we will schedule a treadmill exercise stress test to evaluate his exercise capacity and any ischemic changes. As he is not a good candidate for statin therapy with significant side effects in the past we will order a calcium score for risk stratification for ischemic heart disease. Patient has to call us if he wants to go forward with the calcium score.  2. Blood pressure - well controlled  3. Hyperlipidemia - HDL and triglycerides at goal, LDL 114, ideally we would like it under 100 considering he has one risk factor we will  restudy file with calcium score and see if we need to potentially try another statin or stop using lipid therapy altogether.  Follow up in 1 year    Dorothy Spark, MD, [facility] 11/28/2014, 11:47 AM

## 2014-11-29 ENCOUNTER — Encounter: Payer: 59 | Admitting: Family Medicine

## 2014-12-09 ENCOUNTER — Ambulatory Visit (INDEPENDENT_AMBULATORY_CARE_PROVIDER_SITE_OTHER)
Admission: RE | Admit: 2014-12-09 | Discharge: 2014-12-09 | Disposition: A | Discharge status: Home / Self Care | Payer: 59 | Source: Ambulatory Visit | Attending: Cardiology | Admitting: Cardiology

## 2014-12-09 DIAGNOSIS — Z8249 Family history of ischemic heart disease and other diseases of the circulatory system: Secondary | ICD-10-CM

## 2015-01-01 ENCOUNTER — Encounter: Payer: Self-pay | Admitting: Family Medicine

## 2015-01-01 DIAGNOSIS — E785 Hyperlipidemia, unspecified: Secondary | ICD-10-CM | POA: Insufficient documentation

## 2015-01-08 ENCOUNTER — Other Ambulatory Visit (HOSPITAL_COMMUNITY): Payer: Self-pay | Admitting: Urology

## 2015-01-08 DIAGNOSIS — R972 Elevated prostate specific antigen [PSA]: Secondary | ICD-10-CM

## 2015-01-10 ENCOUNTER — Encounter: Payer: 59 | Admitting: Nurse Practitioner

## 2015-01-20 ENCOUNTER — Encounter: Payer: Self-pay | Admitting: Family Medicine

## 2015-01-24 ENCOUNTER — Other Ambulatory Visit: Payer: Self-pay | Admitting: *Deleted

## 2015-01-24 DIAGNOSIS — Z8249 Family history of ischemic heart disease and other diseases of the circulatory system: Secondary | ICD-10-CM

## 2015-01-24 DIAGNOSIS — R0609 Other forms of dyspnea: Secondary | ICD-10-CM

## 2015-01-28 ENCOUNTER — Encounter: Payer: Self-pay | Admitting: Nurse Practitioner

## 2015-01-28 ENCOUNTER — Ambulatory Visit (INDEPENDENT_AMBULATORY_CARE_PROVIDER_SITE_OTHER): Payer: 59 | Admitting: Nurse Practitioner

## 2015-01-28 VITALS — BP 113/79 | HR 85

## 2015-01-28 DIAGNOSIS — R0609 Other forms of dyspnea: Secondary | ICD-10-CM

## 2015-01-28 DIAGNOSIS — R9439 Abnormal result of other cardiovascular function study: Secondary | ICD-10-CM

## 2015-01-28 DIAGNOSIS — Z8249 Family history of ischemic heart disease and other diseases of the circulatory system: Secondary | ICD-10-CM

## 2015-01-28 NOTE — Patient Instructions (Signed)
We will arrange for a stress Myoview

## 2015-01-28 NOTE — Progress Notes (Signed)
Exercise Treadmill Test  Pre-Exercise Testing Evaluation Rhythm: normal sinus  Rate: 75     Test  Exercise Tolerance Test Ordering MD: Ena Dawley, MD  Interpreting MD: Truitt Merle, NP  Unique Test No: 1  Treadmill:  1  Indication for ETT: DOE, Family hx of early CAD  Contraindication to ETT: No   Stress Modality: exercise - treadmill  Cardiac Imaging Performed: non   Protocol: standard Bruce - maximal  Max BP:  144/96  Max MPHR (bpm):  164 85% MPR (bpm):  139  MPHR obtained (bpm):  160 % MPHR obtained:  94%  Reached 85% MPHR (min:sec):  9:16 Total Exercise Time (min-sec):  12 minutes  Workload in METS:  13.4 Borg Scale: 15  Reason ETT Terminated:  desired heart rate attained    ST Segment Analysis At Rest: normal ST segments - no evidence of significant ST depression With Exercise: no evidence of significant ST depression  Other Information Arrhythmia:  No Angina during ETT:  absent (0) Quality of ETT:  diagnostic  ETT Interpretation:  normal - no evidence of ischemia by ST analysis  Comments: Patient presents today for routine GXT. Has + FH for early CAD and has a history of HLD. Today the patient exercised on the standard Bruce protocol for a total of 12 minutes.  Good exercise tolerance.  Hypotensive blood pressure response.  Clinically negative for chest pain. Test was stopped due to achievement of target HR.  EKG negative for ischemia. No significant arrhythmia noted.   Recommendations: This study has been reviewed with Dr. Meda Coffee - noted to have hypotensive BP response - the GXT is felt to be abnormal. Will arrange for stress Myoview. Further disposition to follow.  Patient is agreeable to this plan and will call if any problems develop in the interim.   Burtis Junes, RN, [facility] [address] [place], [place]  [phone]

## 2015-01-30 ENCOUNTER — Ambulatory Visit (HOSPITAL_COMMUNITY)
Admission: RE | Admit: 2015-01-30 | Discharge: 2015-01-30 | Disposition: A | Discharge status: Home / Self Care | Payer: 59 | Source: Ambulatory Visit | Attending: Urology | Admitting: Urology

## 2015-01-30 DIAGNOSIS — N400 Enlarged prostate without lower urinary tract symptoms: Secondary | ICD-10-CM | POA: Diagnosis not present

## 2015-01-30 DIAGNOSIS — R972 Elevated prostate specific antigen [PSA]: Secondary | ICD-10-CM | POA: Diagnosis present

## 2015-01-30 DIAGNOSIS — N411 Chronic prostatitis: Secondary | ICD-10-CM | POA: Diagnosis not present

## 2015-01-30 LAB — POCT I-STAT CREATININE: Creatinine, Ser: 1 mg/dL (ref 0.50–1.35)

## 2015-01-30 MED ORDER — GADOBENATE DIMEGLUMINE 529 MG/ML IV SOLN
15.0000 mL | Freq: Once | INTRAVENOUS | Status: AC | PRN
  Administered 2015-01-30: 15 mL via INTRAVENOUS

## 2015-02-05 ENCOUNTER — Ambulatory Visit (HOSPITAL_COMMUNITY): Payer: 59 | Attending: Cardiology | Admitting: Radiology

## 2015-02-05 DIAGNOSIS — R9439 Abnormal result of other cardiovascular function study: Secondary | ICD-10-CM

## 2015-02-05 DIAGNOSIS — E785 Hyperlipidemia, unspecified: Secondary | ICD-10-CM | POA: Diagnosis not present

## 2015-02-05 DIAGNOSIS — R0609 Other forms of dyspnea: Secondary | ICD-10-CM

## 2015-02-05 DIAGNOSIS — Z8249 Family history of ischemic heart disease and other diseases of the circulatory system: Secondary | ICD-10-CM | POA: Insufficient documentation

## 2015-02-05 MED ORDER — TECHNETIUM TC 99M SESTAMIBI GENERIC - CARDIOLITE
10.0000 | Freq: Once | INTRAVENOUS | Status: AC | PRN
  Administered 2015-02-05: 10 via INTRAVENOUS

## 2015-02-05 MED ORDER — TECHNETIUM TC 99M SESTAMIBI GENERIC - CARDIOLITE
30.0000 | Freq: Once | INTRAVENOUS | Status: AC | PRN
  Administered 2015-02-05: 30 via INTRAVENOUS

## 2015-02-05 NOTE — Progress Notes (Signed)
[facility] [address] [place], [place] [address] [phone]    Cardiology Nuclear Med Study  Edward Medina is a 59 y.o. male     MRN : [id number]     DOB: 01/03/1956  Procedure Date: 02/05/2015  Nuclear Med Background Indication for Stress Test:  Evaluation for Ischemia, and 01-28-2015 GXT: Walked 12 minutes, no EKG changes, (+) hypotensive response History:  No Cardiac History Cardiac Risk Factors: Family History - CAD and Lipids  Symptoms:  N/A   Nuclear Pre-Procedure Caffeine/Decaff Intake:  None> 12 hrs NPO After: 6:30pm   Lungs:  clear O2 Sat: 97% on room air. IV 0.9% NS with Angio Cath:  20g  IV Site: R Antecubital x 1, tolerated well IV Started by:  Irven Baltimore, RN  Chest Size (in):  40 Cup Size: n/a  Height: 5' 8.5" (1.74 m)  Weight:  168 lb (76.204 kg)  BMI:  Body mass index is 25.17 kg/(m^2). Tech Comments:  N/A    Nuclear Med Study 1 or 2 day study: 1 day  Stress Test Type:  Stress  Reading MD: N/A  Order Authorizing Provider:  Ena Dawley, MD, and Truitt Merle, NP  Resting Radionuclide: Technetium 37m Sestamibi  Resting Radionuclide Dose: 11.0 mCi   Stress Radionuclide:  Technetium 63m Sestamibi  Stress Radionuclide Dose: 33.0 mCi           Stress Protocol Rest HR: 65 Stress HR: 155  Rest BP: 107/77 Stress BP: 176/82  Exercise Time (min): 12:00 METS: 13.4   Predicted Max HR: 164 bpm % Max HR: 94.51 bpm Rate Pressure Product: 27280   Dose of Adenosine (mg):  n/a Dose of Lexiscan: n/a mg  Dose of Atropine (mg): n/a Dose of Dobutamine: n/a mcg/kg/min (at max HR)  Stress Test Technologist: Crissie Figures, RN  Nuclear Technologist:  Earl Many, CNMT     Rest Procedure:  Myocardial perfusion imaging was performed at rest 45 minutes following the intravenous administration of Technetium 76m Sestamibi. Rest ECG: NSR - Normal EKG  Stress Procedure:  The patient exercised on the treadmill utilizing the Bruce  Protocol for 12:00 minutes. The patient stopped due to leg pain and denied any chest pain.  Technetium 72m Sestamibi was injected at peak exercise and myocardial perfusion imaging was performed after a brief delay. Stress ECG: No significant change from baseline ECG  QPS Raw Data Images:  Normal; no motion artifact; normal heart/lung ratio. Stress Images:  There is mild decreased uptake, small in size and severity along the apical septal region slightly worse at stress than at rest. This defect is seen in the vicinity of the RV insertion point. A very small degree of ischemia cannot be excluded. Rest Images:  Unless stated above, homogeneous radiotracer uptake Subtraction (SDS):  2 Transient Ischemic Dilatation (Normal <1.22):  0.88 Lung/Heart Ratio (Normal <0.45):  0.32  Quantitative Gated Spect Images QGS EDV:  100 ml QGS ESV:  43 ml  Impression Exercise Capacity:  Excellent exercise capacity. BP Response:  Normal blood pressure response. Clinical Symptoms:  No chest pain. ECG Impression:  No significant ST segment change suggestive of ischemia. Comparison with Prior Nuclear Study: No images to compare  Overall Impression:  Low risk stress nuclear study With small area of apical septal reversibility noted. Cannot exclude a very small area of ischemia. This may be representative however of RV insertion point. This defect is slightly worse at stress than at rest..  LV Ejection Fraction:  57%.  LV Wall Motion:  NL LV Function; NL Wall Motion   Overall reassuring study. Excellent exercise time. Low risk.  Candee Furbish, MD

## 2015-02-10 ENCOUNTER — Encounter: Payer: Self-pay | Admitting: *Deleted

## 2015-02-11 ENCOUNTER — Telehealth: Payer: Self-pay | Admitting: *Deleted

## 2015-02-11 DIAGNOSIS — E7800 Pure hypercholesterolemia, unspecified: Secondary | ICD-10-CM

## 2015-02-11 MED ORDER — ATORVASTATIN CALCIUM 10 MG PO TABS: 10.0000 mg | ORAL_TABLET | Freq: Every day | ORAL | Status: DC

## 2015-02-11 NOTE — Telephone Encounter (Signed)
-----   Message from Dorothy Spark, MD sent at 02/06/2015  6:19 PM EST ----- His stress test is negative, however considering abnormal calcium score (evidence of atherosclerotic coronary disease), elevated LDL and significant family h/o CAD I would start him on atorvastatin 10 mg po daily and follow CMP in 1 month. He should stop taking red yeast rice. [place], [place]

## 2015-02-11 NOTE — Telephone Encounter (Signed)
Informed the pt that per Dr Meda Coffee his stress test is negative and his calcium score was abnormal evident of atherosclerotic coronary disease.  Informed the pt that his LDL is elevated and being he has a significant family hx of CAD Dr Meda Coffee would like to start him on atorvastatin 10 mg po daily and have a cmet drawn in 1 month.  Also informed the pt that per Dr Meda Coffee he should d/c his red yeast rice.  Confirmed the pharmacy of choice with the pt.  Lab appt made for 03/11/15 as requested by the pt to check a cmet.  Pt verbalized understanding and agrees with this plan.

## 2015-03-11 ENCOUNTER — Other Ambulatory Visit: Payer: 59

## 2015-03-18 ENCOUNTER — Encounter: Payer: Self-pay | Admitting: Cardiology

## 2015-03-19 ENCOUNTER — Other Ambulatory Visit (INDEPENDENT_AMBULATORY_CARE_PROVIDER_SITE_OTHER): Payer: 59 | Admitting: *Deleted

## 2015-03-19 DIAGNOSIS — E7800 Pure hypercholesterolemia, unspecified: Secondary | ICD-10-CM

## 2015-03-19 DIAGNOSIS — E785 Hyperlipidemia, unspecified: Secondary | ICD-10-CM

## 2015-03-19 DIAGNOSIS — Z8249 Family history of ischemic heart disease and other diseases of the circulatory system: Secondary | ICD-10-CM

## 2015-03-19 LAB — COMPREHENSIVE METABOLIC PANEL
ALT: 44 U/L (ref 0–53)
AST: 32 U/L (ref 0–37)
Albumin: 4.3 g/dL (ref 3.5–5.2)
Alkaline Phosphatase: 54 U/L (ref 39–117)
BUN: 20 mg/dL (ref 6–23)
CO2: 31 mEq/L (ref 19–32)
Calcium: 10 mg/dL (ref 8.4–10.5)
Chloride: 101 mEq/L (ref 96–112)
Creatinine, Ser: 0.97 mg/dL (ref 0.40–1.50)
GFR: 85.01 mL/min (ref >60.00)
Glucose, Bld: 78 mg/dL (ref 70–99)
Potassium: 3.8 mEq/L (ref 3.5–5.1)
Sodium: 137 mEq/L (ref 135–145)
Total Bilirubin: 0.7 mg/dL (ref 0.2–1.2)
Total Protein: 7.4 g/dL (ref 6.0–8.3)

## 2015-03-19 NOTE — Addendum Note (Signed)
Addended by: Eulis Foster on: 03/19/2015 08:24 AM   Modules accepted: Orders

## 2015-03-25 ENCOUNTER — Telehealth: Payer: Self-pay | Admitting: *Deleted

## 2015-03-25 MED ORDER — PRAVASTATIN SODIUM 10 MG PO TABS: 10.0000 mg | ORAL_TABLET | Freq: Every day | ORAL | Status: DC

## 2015-03-25 NOTE — Telephone Encounter (Signed)
Contacted the pt to inform him that per Dr Meda Coffee, she is aware of his intolerance to simvastatin and atorvastatin with causing him cognitive issues at times, but she recommends that he d/c the atorvastatin and try pravastatin 10 mg po daily and see how he tolerates this.  Advised the pt that he should provide our office with feedback on how he responds to this med, by sending a message through [place] or calling into the office.  Pt requesting just a month supply sent to his pharmacy of choice until he knows how he will respond to this med.  Sent a month supply to pharmacy of choice.  Updated intolerance in pts allergies to atorvastatin.  Pt verbalized understanding and agrees with this plan.

## 2015-03-25 NOTE — Telephone Encounter (Signed)
-----   Message from Dorothy Spark, MD sent at 03/24/2015 11:08 PM EDT ----- I would try 10 mg of pravastatin and see how he tolerates it. Thank you, KN

## 2015-05-06 ENCOUNTER — Encounter: Payer: Self-pay | Admitting: Cardiology

## 2015-05-06 DIAGNOSIS — Z789 Other specified health status: Secondary | ICD-10-CM

## 2015-05-07 NOTE — Telephone Encounter (Signed)
Informed the pt through my chart that per Dr Meda Coffee she would like for the pt to be referred to our lipid clinic with Elberta Leatherwood Pharm D in our office, for multiple statin intolerance.  Informed the pt through mychart that we will send our schedulers a message to contact the pt to set this appt up.

## 2015-05-08 NOTE — Telephone Encounter (Signed)
Pt scheduled in lipid clinic for 05/20/15 with Elberta Leatherwood PharmD at 0930.

## 2015-05-20 ENCOUNTER — Ambulatory Visit: Payer: 59 | Admitting: Pharmacist

## 2015-05-20 ENCOUNTER — Other Ambulatory Visit (INDEPENDENT_AMBULATORY_CARE_PROVIDER_SITE_OTHER): Payer: 59 | Admitting: *Deleted

## 2015-05-20 ENCOUNTER — Encounter: Payer: Self-pay | Admitting: Pharmacist Clinician (PhC)/ Clinical Pharmacy Specialist

## 2015-05-20 ENCOUNTER — Ambulatory Visit (INDEPENDENT_AMBULATORY_CARE_PROVIDER_SITE_OTHER): Payer: 59 | Admitting: Pharmacist Clinician (PhC)/ Clinical Pharmacy Specialist

## 2015-05-20 ENCOUNTER — Telehealth: Payer: Self-pay | Admitting: Pharmacist Clinician (PhC)/ Clinical Pharmacy Specialist

## 2015-05-20 DIAGNOSIS — E785 Hyperlipidemia, unspecified: Secondary | ICD-10-CM

## 2015-05-20 LAB — LIPID PANEL
Cholesterol: 177 mg/dL (ref 0–200)
HDL: 49.6 mg/dL (ref >39.00)
LDL Cholesterol: 109 mg/dL — ABNORMAL HIGH (ref 0–99)
NonHDL: 127.4
Total CHOL/HDL Ratio: 4
Triglycerides: 92 mg/dL (ref 0.0–149.0)
VLDL: 18.4 mg/dL (ref 0.0–40.0)

## 2015-05-20 LAB — HEPATIC FUNCTION PANEL
ALT: 50 U/L (ref 0–53)
AST: 36 U/L (ref 0–37)
Albumin: 4.2 g/dL (ref 3.5–5.2)
Alkaline Phosphatase: 65 U/L (ref 39–117)
Bilirubin, Direct: 0.1 mg/dL (ref 0.0–0.3)
Total Bilirubin: 0.5 mg/dL (ref 0.2–1.2)
Total Protein: 7 g/dL (ref 6.0–8.3)

## 2015-05-20 MED ORDER — EZETIMIBE 10 MG PO TABS: 10.0000 mg | ORAL_TABLET | Freq: Every day | ORAL | Status: DC

## 2015-05-20 NOTE — Telephone Encounter (Signed)
Pt was in office for lipid clinic appointment this am.  Had not had labs since last fall.  Did have NMR drawn in March, but results were never reported.  Pt repeated lipid profile after visit.    Reviewed today's results with patient, confirmed with current plan to use Zetia daily.  Will repeat labs on August 15.  Advised him we will call with results, see him if necessary.

## 2015-05-20 NOTE — Patient Instructions (Signed)
Start Zetia 10 mg once daily  Check your cholesterol labs today, then again in 3 months.  Continue to follow a healthy diet and lifestyle

## 2015-05-20 NOTE — Assessment & Plan Note (Addendum)
Pt has been taking pravastatin 10 mg daily, but having problems with muscle pains in legs/knees.  Willing to try another statin, but concerned about risk of memory problems.  Dr. Meda Coffee and patient would like to keep LDL <100.  Reviewed the option of weekly Crestor versus Zetia 10 mg daily.  Patient decided on Zetia, gave him some samples and discount copay card.  If patient finds this to be cost prohibitive, he is willing to switch to Crestor, but decided to try Zetia first.  Will repeat labs in August and determine from there whether we need to see patient again.

## 2015-05-20 NOTE — Progress Notes (Signed)
05/20/2015 Edward Medina September 28, 1956 [id number]   HPI:  Edward Medina is a 59 y.o. male patient of Dr. Meda Coffee, who presents today for a lipid clinic evaluation.  Patient does not have any personal history of ASCVD, but his father died at the age of 38 from a heart attack.     Meds: pravastatin 10 mg qd   Tried in the past: simvastatin - took about 10-12 years ago, caused memory issues.  Atorvastatin caused memory issues as well - pt describes as his brain was "wrapped in saran-wrap" -foggy, confused easily, trouble forming sentences.  Pravastatin started recently, no memory concerns, but did develop muscle/joint pain in knees.  Family history: father fatal MI at 20  Diet: watches closely - cereal, yogurt, or egg whites for breakfast, skips lunch many days, although works from home, so sometimes eats leftovers; dinner includes plenty of vegetables, salad, pasta, chicken  Exercise:  Tries to stay active, plays floor hockey regularly  Labs:  04/2015:    TC 147, TG 92, HDL 49.6, LDL 109, nonHDL 127.4 (pravastatin 10 mg) 09/2014:  TC 183, TG 78, HDL 53, LDL 114, nonHDL 130 11/2013:  TC 203, TG 91, HDL 52, LDL 133, nonHLD 151   Current Outpatient Prescriptions  Medication Sig Dispense Refill  . aluminum chloride (DRYSOL) 20 % external solution Apply topically at bedtime.    Marland Kitchen aspirin 81 MG tablet Take 81 mg by mouth daily.    . fish oil-omega-3 fatty acids 1000 MG capsule Take 2 g by mouth 3 (three) times a week.     . Multiple Vitamin (MULTI VITAMIN MENS PO) Take by mouth daily.    . pravastatin (PRAVACHOL) 10 MG tablet Take 1 tablet (10 mg total) by mouth daily. 30 tablet 1  . Saw Palmetto, Serenoa repens, (SAW PALMETTO PO) Take by mouth daily.    Marland Kitchen terazosin (HYTRIN) 5 MG capsule Take 5 mg by mouth at bedtime.     No current facility-administered medications for this visit.    Allergies  Allergen Reactions  . Atorvastatin Other (See Comments)    Memory impairment  . Simvastatin  Other (See Comments)    MADE HIM FEEL DISORIENTED  . Amoxicillin Rash    SEVERE    Past Medical History  Diagnosis Date  . Asthma     infant  . Hypoglycemia     There were no vitals taken for this visit.   ASSESSMENT AND PLAN:  Edward Medina PharmD CPP [facility]

## 2015-06-08 ENCOUNTER — Encounter: Payer: Self-pay | Admitting: Family Medicine

## 2015-06-09 ENCOUNTER — Encounter: Payer: Self-pay | Admitting: Family Medicine

## 2015-06-09 ENCOUNTER — Other Ambulatory Visit: Payer: Self-pay | Admitting: *Deleted

## 2015-06-09 ENCOUNTER — Encounter: Payer: Self-pay | Admitting: Cardiology

## 2015-06-09 MED ORDER — EZETIMIBE 10 MG PO TABS: 10.0000 mg | ORAL_TABLET | Freq: Every day | ORAL | Status: DC

## 2015-06-09 NOTE — Telephone Encounter (Signed)
Called patient to verify pharmacy for rx of Zetia. Per insurance he needs to receive 90 rx and [facility].

## 2015-08-04 ENCOUNTER — Other Ambulatory Visit (INDEPENDENT_AMBULATORY_CARE_PROVIDER_SITE_OTHER): Payer: 59 | Admitting: *Deleted

## 2015-08-04 DIAGNOSIS — E785 Hyperlipidemia, unspecified: Secondary | ICD-10-CM | POA: Diagnosis not present

## 2015-08-04 LAB — LIPID PANEL
Cholesterol: 188 mg/dL (ref 0–200)
HDL: 51.3 mg/dL (ref >39.00)
LDL Cholesterol: 122 mg/dL — ABNORMAL HIGH (ref 0–99)
NonHDL: 136.73
Total CHOL/HDL Ratio: 4
Triglycerides: 76 mg/dL (ref 0.0–149.0)
VLDL: 15.2 mg/dL (ref 0.0–40.0)

## 2015-08-04 LAB — HEPATIC FUNCTION PANEL
ALT: 43 U/L (ref 0–53)
AST: 28 U/L (ref 0–37)
Albumin: 4.2 g/dL (ref 3.5–5.2)
Alkaline Phosphatase: 69 U/L (ref 39–117)
Bilirubin, Direct: 0.1 mg/dL (ref 0.0–0.3)
Total Bilirubin: 0.8 mg/dL (ref 0.2–1.2)
Total Protein: 7.1 g/dL (ref 6.0–8.3)

## 2015-08-05 ENCOUNTER — Telehealth: Payer: Self-pay | Admitting: Cardiology

## 2015-08-05 DIAGNOSIS — E785 Hyperlipidemia, unspecified: Secondary | ICD-10-CM

## 2015-08-05 NOTE — Telephone Encounter (Signed)
Notified the pt that per Dr Meda Coffee:  Notes Recorded by Dorothy Spark, MD on 08/04/2015 at 5:15 PM Triglycerides and HDL at goal, however LDL 122, I would increase pravastatin to 20 mg po daily, normal LFTs.  Per the pt he states that he is no longer taking Pravastatin, as endorsed to [facility] in [facility] on 05/20/15, for he is intolerant to this statin, as it causes him memory impairment.  Per the pt he was then started on Zetia 10 mg po daily and discontinued from pravastatin, for substitution for statin intolerance and hyperlipidemia.  Informed the pt this was not endorsed on his med list.   Informed the pt that I will discontinue the pravastatin out of his med list, and endorse this intolerance on his allergy list.  Informed the pt I will also make Dr Meda Coffee aware of the pt only taking Zetia 10 mg po daily, and stopped the pravastatin, and follow-up with the pt after she advises on a different medication regimen or increase, to target his LDL of 122.  Pt verbalized understanding and agrees with this plan.

## 2015-08-05 NOTE — Telephone Encounter (Signed)
New message     Pt returning your call Pt will be available between 1-3 today

## 2015-08-06 NOTE — Telephone Encounter (Signed)
Informed the pt that per Dr Meda Coffee he should continue on his current dose of zetia.  Pt wanted to ask Dr Meda Coffee if he should have his lipids rechecked in 90 days.  Informed the pt that per Dr Meda Coffee we can recheck his lipids and cmet in 3 months.  Scheduled the pt for a lab appt for 11/06/15 at our office. Informed the pt to come fasting to this appt.  Pt verbalized understanding, agrees with this plan, and gracious for all the assistance provided.

## 2015-08-06 NOTE — Telephone Encounter (Signed)
Ok, he should continue zetia then

## 2015-10-06 ENCOUNTER — Telehealth: Payer: Self-pay

## 2015-10-06 NOTE — Telephone Encounter (Signed)
Edward Medina    Patient is requesting lab orders for a pre - CPE w/ you early November.  He would like to come in for his labs on the 26th Oct.  [phone] (H)

## 2015-10-07 ENCOUNTER — Other Ambulatory Visit: Payer: Self-pay | Admitting: Physician Assistant

## 2015-10-07 DIAGNOSIS — Z139 Encounter for screening, unspecified: Secondary | ICD-10-CM

## 2015-10-07 NOTE — Telephone Encounter (Signed)
Left voicemail advising pt future orders in.

## 2015-10-07 NOTE — Telephone Encounter (Signed)
Future orders in.  He can come in at his convenience.

## 2015-10-21 ENCOUNTER — Encounter: Payer: Self-pay | Admitting: Physician Assistant

## 2015-10-28 ENCOUNTER — Ambulatory Visit (INDEPENDENT_AMBULATORY_CARE_PROVIDER_SITE_OTHER): Payer: 59

## 2015-10-28 ENCOUNTER — Ambulatory Visit (INDEPENDENT_AMBULATORY_CARE_PROVIDER_SITE_OTHER): Payer: 59 | Admitting: Emergency Medicine

## 2015-10-28 ENCOUNTER — Encounter: Payer: Self-pay | Admitting: Physician Assistant

## 2015-10-28 VITALS — BP 112/76 | HR 64 | Temp 97.9°F | Resp 16 | Wt 171.2 lb

## 2015-10-28 DIAGNOSIS — M25562 Pain in left knee: Secondary | ICD-10-CM | POA: Diagnosis not present

## 2015-10-28 DIAGNOSIS — Z130 Encounter for screening for diseases of the blood and blood-forming organs and certain disorders involving the immune mechanism: Secondary | ICD-10-CM

## 2015-10-28 DIAGNOSIS — Z0000 Encounter for general adult medical examination without abnormal findings: Secondary | ICD-10-CM

## 2015-10-28 DIAGNOSIS — Z13220 Encounter for screening for lipoid disorders: Secondary | ICD-10-CM

## 2015-10-28 DIAGNOSIS — Z139 Encounter for screening, unspecified: Secondary | ICD-10-CM

## 2015-10-28 DIAGNOSIS — Z23 Encounter for immunization: Secondary | ICD-10-CM

## 2015-10-28 DIAGNOSIS — Z1329 Encounter for screening for other suspected endocrine disorder: Secondary | ICD-10-CM | POA: Diagnosis not present

## 2015-10-28 DIAGNOSIS — Z13228 Encounter for screening for other metabolic disorders: Secondary | ICD-10-CM

## 2015-10-28 LAB — COMPLETE METABOLIC PANEL WITH GFR
ALBUMIN: 4.3 g/dL (ref 3.6–5.1)
ALK PHOS: 74 U/L (ref 40–115)
ALT: 56 U/L — AB (ref 9–46)
AST: 29 U/L (ref 10–35)
BUN: 17 mg/dL (ref 7–25)
CALCIUM: 9.7 mg/dL (ref 8.6–10.3)
CO2: 29 mmol/L (ref 20–31)
CREATININE: 0.95 mg/dL (ref 0.70–1.33)
Chloride: 101 mmol/L (ref 98–110)
GFR, Est African American: >89 mL/min (ref >=60)
GFR, Est Non African American: 89 mL/min (ref >=60)
GLUCOSE: 86 mg/dL (ref 65–99)
POTASSIUM: 4.7 mmol/L (ref 3.5–5.3)
SODIUM: 139 mmol/L (ref 135–146)
Total Bilirubin: 1 mg/dL (ref 0.2–1.2)
Total Protein: 7 g/dL (ref 6.1–8.1)

## 2015-10-28 LAB — LIPID PANEL
Cholesterol: 201 mg/dL — ABNORMAL HIGH (ref 125–200)
HDL: 59 mg/dL (ref >=40)
LDL CALC: 117 mg/dL (ref <130)
TRIGLYCERIDES: 127 mg/dL (ref <150)
Total CHOL/HDL Ratio: 3.4 Ratio (ref <=5.0)
VLDL: 25 mg/dL (ref <30)

## 2015-10-28 LAB — HEPATITIS C ANTIBODY: HCV Ab: NEGATIVE

## 2015-10-28 LAB — CBC
HEMATOCRIT: 47 % (ref 39.0–52.0)
HEMOGLOBIN: 16.2 g/dL (ref 13.0–17.0)
MCH: 30.1 pg (ref 26.0–34.0)
MCHC: 34.5 g/dL (ref 30.0–36.0)
MCV: 87.4 fL (ref 78.0–100.0)
MPV: 10.7 fL (ref 8.6–12.4)
Platelets: 222 10*3/uL (ref 150–400)
RBC: 5.38 MIL/uL (ref 4.22–5.81)
RDW: 13 % (ref 11.5–15.5)
WBC: 4.1 10*3/uL (ref 4.0–10.5)

## 2015-10-28 LAB — TSH: TSH: 0.724 u[IU]/mL (ref 0.350–4.500)

## 2015-10-28 NOTE — Patient Instructions (Addendum)
Keeping you healthy  Get these tests  Blood pressure- Have your blood pressure checked once a year by your healthcare provider.  Normal blood pressure is 120/80  Weight- Have your body mass index (BMI) calculated to screen for obesity.  BMI is a measure of body fat based on height and weight. You can also calculate your own BMI at ViewBanking.si.  Cholesterol- Have your cholesterol checked every year.  Diabetes- Have your blood sugar checked regularly if you have high blood pressure, high cholesterol, have a family history of diabetes or if you are overweight.  Screening for Colon Cancer- Colonoscopy starting at age 57.  Screening may begin sooner depending on your family history and other health conditions. Follow up colonoscopy as directed by your Gastroenterologist.  Screening for Prostate Cancer- Both blood work (PSA) and a rectal exam help screen for Prostate Cancer.  Screening begins at age 57 with African-American men and at age 52 with Caucasian men.  Screening may begin sooner depending on your family history.  Take these medicines  Aspirin- One aspirin daily can help prevent Heart disease and Stroke.  Flu shot- Every fall.  Tetanus- Every 10 years.  Zostavax- Once after the age of 24 to prevent Shingles.  Pneumonia shot- Once after the age of 65; if you are younger than 68, ask your healthcare provider if you need a Pneumonia shot.  Take these steps  Don't smoke- If you do smoke, talk to your doctor about quitting.  For tips on how to quit, go to www.smokefree.gov or call 1-800-QUIT-NOW.  Be physically active- Exercise 5 days a week for at least 30 minutes.  If you are not already physically active start slow and gradually work up to 30 minutes of moderate physical activity.  Examples of moderate activity include walking briskly, mowing the yard, dancing, swimming, bicycling, etc.  Eat a healthy diet- Eat a variety of healthy food such as fruits, vegetables, low  fat milk, low fat cheese, yogurt, lean meant, poultry, fish, beans, tofu, etc. For more information go to www.thenutritionsource.org  Drink alcohol in moderation- Limit alcohol intake to less than two drinks a day. Never drink and drive.  Dentist- Brush and floss twice daily; visit your dentist twice a year.  Depression- Your emotional health is as important as your physical health. If you're feeling down, or losing interest in things you would normally enjoy please talk to your healthcare provider.  Eye exam- Visit your eye doctor every year.  Safe sex- If you may be exposed to a sexually transmitted infection, use a condom.  Seat belts- Seat belts can save your life; always wear one.  Smoke/Carbon Monoxide detectors- These detectors need to be installed on the appropriate level of your home.  Replace batteries at least once a year.  Skin cancer- When out in the sun, cover up and use sunscreen 15 SPF or higher.  Violence- If anyone is threatening you, please tell your healthcare provider.  Living Will/ Health care power of attorney- Speak with your healthcare provider and family.  Generic Knee Exercises EXERCISES RANGE OF MOTION (ROM) AND STRETCHING EXERCISES These exercises may help you when beginning to rehabilitate your injury. Your symptoms may resolve with or without further involvement from your physician, physical therapist, or athletic trainer. While completing these exercises, remember:   Restoring tissue flexibility helps normal motion to return to the joints. This allows healthier, less painful movement and activity.  An effective stretch should be held for at least 30 seconds.  A stretch should never be painful. You should only feel a gentle lengthening or release in the stretched tissue. STRETCH - Knee Extension, Prone  Lie on your stomach on a firm surface, such as a bed or countertop. Place your right / left knee and leg just beyond the edge of the surface. You may  wish to place a towel under the far end of your right / left thigh for comfort.  Relax your leg muscles and allow gravity to straighten your knee. Your clinician may advise you to add an ankle weight if more resistance is helpful for you.  You should feel a stretch in the back of your right / left knee. Hold this position for __________ seconds. Repeat __________ times. Complete this stretch __________ times per day. * Your physician, physical therapist, or athletic trainer may ask you to add ankle weight to enhance your stretch.  RANGE OF MOTION - Knee Flexion, Active  Lie on your back with both knees straight. (If this causes back discomfort, bend your opposite knee, placing your foot flat on the floor.)  Slowly slide your heel back toward your buttocks until you feel a gentle stretch in the front of your knee or thigh.  Hold for __________ seconds. Slowly slide your heel back to the starting position. Repeat __________ times. Complete this exercise __________ times per day.  STRETCH - Quadriceps, Prone   Lie on your stomach on a firm surface, such as a bed or padded floor.  Bend your right / left knee and grasp your ankle. If you are unable to reach your ankle or pant leg, use a belt around your foot to lengthen your reach.  Gently pull your heel toward your buttocks. Your knee should not slide out to the side. You should feel a stretch in the front of your thigh and/or knee.  Hold this position for __________ seconds. Repeat __________ times. Complete this stretch __________ times per day.  STRETCH - Hamstrings, Supine   Lie on your back. Loop a belt or towel over the ball of your right / left foot.  Straighten your right / left knee and slowly pull on the belt to raise your leg. Do not allow the right / left knee to bend. Keep your opposite leg flat on the floor.  Raise the leg until you feel a gentle stretch behind your right / left knee or thigh. Hold this position for  __________ seconds. Repeat __________ times. Complete this stretch __________ times per day.  STRENGTHENING EXERCISES These exercises may help you when beginning to rehabilitate your injury. They may resolve your symptoms with or without further involvement from your physician, physical therapist, or athletic trainer. While completing these exercises, remember:   Muscles can gain both the endurance and the strength needed for everyday activities through controlled exercises.  Complete these exercises as instructed by your physician, physical therapist, or athletic trainer. Progress the resistance and repetitions only as guided.  You may experience muscle soreness or fatigue, but the pain or discomfort you are trying to eliminate should never worsen during these exercises. If this pain does worsen, stop and make certain you are following the directions exactly. If the pain is still present after adjustments, discontinue the exercise until you can discuss the trouble with your clinician. STRENGTH - Quadriceps, Isometrics  Lie on your back with your right / left leg extended and your opposite knee bent.  Gradually tense the muscles in the front of your right / left thigh.  You should see either your knee cap slide up toward your hip or increased dimpling just above the knee. This motion will push the back of the knee down toward the floor/mat/bed on which you are lying.  Hold the muscle as tight as you can without increasing your pain for __________ seconds.  Relax the muscles slowly and completely in between each repetition. Repeat __________ times. Complete this exercise __________ times per day.  STRENGTH - Quadriceps, Short Arcs   Lie on your back. Place a __________ inch towel roll under your knee so that the knee slightly bends.  Raise only your lower leg by tightening the muscles in the front of your thigh. Do not allow your thigh to rise.  Hold this position for __________  seconds. Repeat __________ times. Complete this exercise __________ times per day.  OPTIONAL ANKLE WEIGHTS: Begin with ____________________, but DO NOT exceed ____________________. Increase in 1 pound/0.5 kilogram increments.  STRENGTH - Quadriceps, Straight Leg Raises  Quality counts! Watch for signs that the quadriceps muscle is working to insure you are strengthening the correct muscles and not "cheating" by substituting with healthier muscles.  Lay on your back with your right / left leg extended and your opposite knee bent.  Tense the muscles in the front of your right / left thigh. You should see either your knee cap slide up or increased dimpling just above the knee. Your thigh may even quiver.  Tighten these muscles even more and raise your leg 4 to 6 inches off the floor. Hold for __________ seconds.  Keeping these muscles tense, lower your leg.  Relax the muscles slowly and completely in between each repetition. Repeat __________ times. Complete this exercise __________ times per day.  STRENGTH - Hamstring, Curls  Lay on your stomach with your legs extended. (If you lay on a bed, your feet may hang over the edge.)  Tighten the muscles in the back of your thigh to bend your right / left knee up to 90 degrees. Keep your hips flat on the bed/floor.  Hold this position for __________ seconds.  Slowly lower your leg back to the starting position. Repeat __________ times. Complete this exercise __________ times per day.  OPTIONAL ANKLE WEIGHTS: Begin with ____________________, but DO NOT exceed ____________________. Increase in 1 pound/0.5 kilogram increments.  STRENGTH - Quadriceps, Squats  Stand in a door frame so that your feet and knees are in line with the frame.  Use your hands for balance, not support, on the frame.  Slowly lower your weight, bending at the hips and knees. Keep your lower legs upright so that they are parallel with the door frame. Squat only within the  range that does not increase your knee pain. Never let your hips drop below your knees.  Slowly return upright, pushing with your legs, not pulling with your hands. Repeat __________ times. Complete this exercise __________ times per day.  STRENGTH - Quadriceps, Wall Slides  Follow guidelines for form closely. Increased knee pain often results from poorly placed feet or knees.  Lean against a smooth wall or door and walk your feet out 18-24 inches. Place your feet hip-width apart.  Slowly slide down the wall or door until your knees bend __________ degrees.* Keep your knees over your heels, not your toes, and in line with your hips, not falling to either side.  Hold for __________ seconds. Stand up to rest for __________ seconds in between each repetition. Repeat __________ times. Complete this exercise __________ times  per day. * Your physician, physical therapist, or athletic trainer will alter this angle based on your symptoms and progress.   This information is not intended to replace advice given to you by your health care provider. Make sure you discuss any questions you have with your health care provider.   Document Released: 10/20/2005 Document Revised: 12/27/2014 Document Reviewed: 03/20/2009 Elsevier Interactive Patient Education [employer].

## 2015-10-28 NOTE — Progress Notes (Signed)
[facility] [address], [place] [address] [phone]  Date:  10/28/2015   Name:  Edward Medina   DOB:  03/14/1956   MRN:  [id number]  PCP:  Delman Cheadle, MD    History of Present Illness:  Edward Medina is a 59 y.o. male patient who presents to [facility] for annual physical exam and cc of left knee pain. Patient reports that he has had left knee pain for 1 month.  He was bowling and as he assumed his shoe would slide forward, he went with the momentum as he bowled--and knee body weight and knee jutted forward with planted left foot.  He states he felt a grinding sensation, but no popping or instability.  There was no knee swelling, erythema, or numbness or tingling that presented at that time.  Now, he notices that he has pain at the patella when his leg is sitting at a 90 degree angle for a long time.  He will need to stretch the leg straight for relief of the symptom.  There is no radiating pain.  No prior hx of trauma to the leg.  He is a fairly active man; playing roller hockey once per week, walks, and runs.   He also has right ear tinnitus for several months.  It is constant high pitched sound.  It is not often heard when there is ambient sound, but in the quiet when he attempts to sleep, he can hear it.  Only occasionally is it interruptive.  He feels as if his hearing is not as good in his right ear.  He uses an ear phone piece, and notices that it is easier to hear through the left.  No drainage, hx of trauma, or dysequilibrium.    -Diet: Fruits vegetables, not a lot of red meat.  Chicken, fish salads.  Egg white.  Eating a lot of jalapeno peppers.  Water intake coffee cup and half.    BM: Constipation chronically.  Suppository intermittent with symptoms which appear to help rather than miralax.  Constipation can cause back spasms when severe.  No blood in stool.  Darkened stool, stone like.  Hydration is a concern due to his urinary frequency, and he tends to avoid.     Urination: frequency, [facility]--followed by urologist for BPH.  Next visit in January.  Sleep: Normal, may have urinating.    Social: Forensic psychologist, Optometrist.  Married with two children in school at [employer]. 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      Patient Active Problem List   Diagnosis Date Noted  . Hyperlipidemia LDL goal <100 01/01/2015  . Osteopenia 11/23/2013  . Family history of coronary artery disease 11/23/2013  . Benign prostatic hypertrophy 12/15/2012    Past Medical History  Diagnosis Date  . Asthma     infant  . Hypoglycemia     Past Surgical History  Procedure Laterality Date  . Hernia repair  2005    Social History  Substance Use Topics  . Smoking status: Never Smoker   . Smokeless tobacco: None  . Alcohol Use: Yes     Comment: drink - beer, wine, and hard liquor    Family History  Problem Relation Age of Onset  . Heart disease Father   . Hyperlipidemia Father   . Stroke Maternal Grandmother   . Heart disease Maternal Grandfather     Allergies  Allergen Reactions  . Atorvastatin Other (See Comments)  Memory impairment  . Pravastatin Other (See Comments)    Memory loss/impairment  . Simvastatin Other (See Comments)    MADE HIM FEEL DISORIENTED  . Amoxicillin Rash    SEVERE    Medication list has been reviewed and updated.  Current Outpatient Prescriptions on File Prior to Visit  Medication Sig Dispense Refill  . aluminum chloride (DRYSOL) 20 % external solution Apply topically at bedtime.    Marland Kitchen aspirin 81 MG tablet Take 81 mg by mouth daily.    Marland Kitchen ezetimibe (ZETIA) 10 MG tablet Take 1 tablet (10 mg total) by mouth daily. 90 tablet 1  . fish oil-omega-3 fatty acids 1000 MG capsule Take 2 g by mouth 3 (three) times a week.     . Multiple Vitamin (MULTI VITAMIN MENS PO) Take by mouth daily.    . Saw Palmetto, Serenoa repens, (SAW PALMETTO PO) Take by mouth daily.    Marland Kitchen terazosin (HYTRIN) 5 MG capsule Take 5 mg  by mouth at bedtime.     No current facility-administered medications on file prior to visit.    Review of Systems  Constitutional: Negative for fever and chills.  HENT: Negative for ear discharge, ear pain and sore throat.   Eyes: Negative for blurred vision and double vision.  Respiratory: Negative for cough, shortness of breath and wheezing.   Cardiovascular: Negative for chest pain, palpitations and leg swelling.  Gastrointestinal: Negative for nausea, vomiting and diarrhea.  Genitourinary: Negative for dysuria, frequency and hematuria.  Skin: Negative for itching and rash.  Neurological: Negative for dizziness and headaches.   ROS otherwise unremarkable unless listed above.   Physical Examination: BP 112/76 mmHg  Pulse 64  Temp(Src) 97.9 F (36.6 C) (Oral)  Resp 16  Wt 171 lb 3.2 oz (77.656 kg) Ideal Body Weight:    Physical Exam  Constitutional: He is oriented to person, place, and time. He appears well-developed and well-nourished. No distress.  HENT:  Head: Normocephalic and atraumatic.  Right Ear: Tympanic membrane, external ear and ear canal normal.  Left Ear: Tympanic membrane, external ear and ear canal normal.  Nose: No mucosal edema or rhinorrhea. Right sinus exhibits no maxillary sinus tenderness and no frontal sinus tenderness. Left sinus exhibits no maxillary sinus tenderness and no frontal sinus tenderness.  Mouth/Throat: No oropharyngeal exudate, posterior oropharyngeal edema or posterior oropharyngeal erythema.  AC>BC bilaterally.  Weber lateralizes to the right ear.  Mild cerumen of the right ear but not impacting visualization.   Eyes: Conjunctivae and EOM are normal. Pupils are equal, round, and reactive to light.  Cardiovascular: Normal rate and regular rhythm.  Exam reveals no friction rub.   No murmur heard. Pulmonary/Chest: Effort normal. No respiratory distress. He has no wheezes.  Abdominal: Soft. Bowel sounds are normal. He exhibits no distension  and no mass. There is no tenderness.  Musculoskeletal: Normal range of motion. He exhibits no edema or tenderness.       Left knee: He exhibits abnormal meniscus (Pain with flexion loading (thesaly test initiating), however there is no pain with rotation with the load.). He exhibits normal range of motion and no swelling. No tenderness found. No medial joint line, no lateral joint line, no MCL, no LCL and no patellar tendon tenderness noted.  Neurological: He is alert and oriented to person, place, and time. He displays normal reflexes.  Skin: Skin is warm and dry. He is not diaphoretic.  Psychiatric: He has a normal mood and affect. His behavior is normal.  [facility] reading (PRIMARY) by  Dr. Everlene Farrier: Negative  Assessment and Plan: Edward Medina is a 59 y.o. male who is here today an annual physical exam and cc of left knee pain and right ear tinnitus. -Advised brace wearing, particularly with sport.  Advised knee stretch and strengthening exercises to be done daily.  We discussed the more harm the benefit of one day per week of leg (knee) activity with roller hockey without conditioning exercises surrounding play.  He will start the leg exercises.  Recommended ice following, and minimal NSAID use for pain.  He will contact if this continues to be an issue.  Likely MRI will be needed with this, or immediate referral to ortho.  Current meds are not causing the tinnitus.  Appears to be a conductive issue.  Will nominate referral to ENT with lab result follow up.      Need for prophylactic vaccination and inoculation against influenza - Plan: Flu Vaccine QUAD 36+ mos IM  Left knee pain - Plan: DG Knee Complete 4 Views Left  Screening - Plan: COMPLETE METABOLIC PANEL WITH GFR, TSH, Hepatitis C antibody, Lipid panel  Annual physical exam - Plan: Flu Vaccine QUAD 36+ mos IM, DG Knee Complete 4 Views Left, COMPLETE METABOLIC PANEL WITH GFR, TSH, Hepatitis C antibody, Lipid panel, CBC  Screening for  deficiency anemia - Plan: CBC  Screening for lipid disorders - Plan: Lipid panel  Screening for thyroid disorder - Plan: TSH  Screening for metabolic disorder - Plan: COMPLETE METABOLIC PANEL WITH GFR    Ivar Drape, PA-C [facility] 10/28/2015 8:58 AM

## 2015-11-06 ENCOUNTER — Telehealth: Payer: Self-pay | Admitting: Cardiology

## 2015-11-06 ENCOUNTER — Other Ambulatory Visit: Payer: 59

## 2015-11-06 NOTE — Telephone Encounter (Signed)
Left message to call back Pt had appt tomorrow for CMET and Lipids. Pt had CMET and lipids drawn at Dr. Perfecto Kingdom office on 11/8 and results are available in EPIC.

## 2015-11-06 NOTE — Telephone Encounter (Signed)
Pt advised lab scheduled for tomorrow, CMET/Lipid profile, were done 10/28/15. Pt advised I am cancelling lab appointment scheduled for tomorrow. Pt advised I will forward to Dr Meda Coffee to let her know labs were done 10/28/15 so she can review them. Pt advised I will forward to Ivy to ask her to follow up with him once Dr Meda Coffee has reviewed lab.

## 2015-11-06 NOTE — Telephone Encounter (Signed)
New Message   Pt wants to know if the blood work that he had done recently   Can it take the place of the labs that is scheduled on 11/07/15  He states that the results are posted in [place]  He will wait to hear back from the RN and then decide if he can cancel the lab appt

## 2015-11-07 ENCOUNTER — Other Ambulatory Visit: Payer: 59

## 2015-11-21 ENCOUNTER — Other Ambulatory Visit: Payer: Self-pay | Admitting: Physician Assistant

## 2015-11-21 DIAGNOSIS — H9311 Tinnitus, right ear: Secondary | ICD-10-CM

## 2015-11-21 DIAGNOSIS — R748 Abnormal levels of other serum enzymes: Secondary | ICD-10-CM

## 2015-11-25 ENCOUNTER — Telehealth: Payer: Self-pay | Admitting: *Deleted

## 2015-11-25 NOTE — Telephone Encounter (Signed)
Would you be able to call [facility] to see what the status is on pt referral.  Pt would like to be seen before the year is out.

## 2015-12-24 ENCOUNTER — Encounter: Payer: Self-pay | Admitting: Cardiology

## 2015-12-24 ENCOUNTER — Ambulatory Visit (INDEPENDENT_AMBULATORY_CARE_PROVIDER_SITE_OTHER): Payer: 59 | Admitting: Cardiology

## 2015-12-24 ENCOUNTER — Telehealth: Payer: Self-pay | Admitting: *Deleted

## 2015-12-24 VITALS — BP 90/60 | HR 82 | Ht 70.0 in | Wt 174.8 lb

## 2015-12-24 DIAGNOSIS — E785 Hyperlipidemia, unspecified: Secondary | ICD-10-CM | POA: Diagnosis not present

## 2015-12-24 DIAGNOSIS — Z8249 Family history of ischemic heart disease and other diseases of the circulatory system: Secondary | ICD-10-CM | POA: Insufficient documentation

## 2015-12-24 DIAGNOSIS — I2510 Atherosclerotic heart disease of native coronary artery without angina pectoris: Secondary | ICD-10-CM | POA: Diagnosis not present

## 2015-12-24 DIAGNOSIS — I2583 Coronary atherosclerosis due to lipid rich plaque: Secondary | ICD-10-CM

## 2015-12-24 HISTORY — DX: Family history of ischemic heart disease and other diseases of the circulatory system: Z82.49

## 2015-12-24 NOTE — Telephone Encounter (Signed)
Left a message for the pt to call back.  2nd attempt in trying to make contact with the pt with no return call back.

## 2015-12-24 NOTE — Telephone Encounter (Signed)
-----   Message from Dorothy Spark, MD sent at 12/24/2015 10:49 AM EST ----- Karlene Einstein,  Please let him know that he currently doesn't qualify for PC-SK 9 inhibitors - a new cholesterol medicine, but we might be able to enrol him in a trial in a near future.  Houston Siren

## 2015-12-24 NOTE — Progress Notes (Signed)
Patient ID: ERIQ BULLOUGH, male   DOB: 09-Feb-1956, 60 y.o.   MRN: [id number]   Patient Name: Edward Medina Date of Encounter: 12/24/2015  Primary Care Provider:  Delman Cheadle, MD Primary Cardiologist: Dorothy Spark  Problem List   Past Medical History  Diagnosis Date  . Asthma     infant  . Hypoglycemia    Past Surgical History  Procedure Laterality Date  . Hernia repair  2005   Allergies  Allergies  Allergen Reactions  . Atorvastatin Other (See Comments)    Memory impairment  . Pravastatin Other (See Comments)    Memory loss/impairment  . Simvastatin Other (See Comments)    MADE HIM FEEL DISORIENTED  . Amoxicillin Rash    SEVERE   HPI  60 year old gentleman with past medical history of hyperlipidemia and family history of premature coronary artery disease. Patient's main concern is that his father died of massive heart attack at age of 46. The patient himself was diagnosed with hyperlipidemia few years ago and for a while taking simvastatin that lead to confusion where he lost his way on her way to work. He is currently taking Fish all and red yeast rice without any side effects. The patient used to have active job however now he works from home and states that his activity level has significantly decreased. He plays floor hockey every Wednesday. He would occasionally feel short of breath especially after a long period of inactivity. He denies any palpitations or syncope. No chest pain, lower extremity edema, claudications or orthopnea. He sticks to healthy diet most of the time.  The patient is following after 6 months, he feels great, exercises on a regular basis and denies any symptoms. He had a calcium score done with 48 in LAD. Started on statins - 3 different ones that led to confusion - significant like not being able to find his way to work. No claudications, DOE, syncope.  Home Medications  Prior to Admission medications   Medication Sig Start Date End Date  Taking? Authorizing Provider  aluminum chloride (DRYSOL) 20 % external solution Apply topically at bedtime.   Yes Historical Provider, MD  aspirin 81 MG tablet Take 81 mg by mouth daily.   Yes Historical Provider, MD  fish oil-omega-3 fatty acids 1000 MG capsule Take 2 g by mouth 3 (three) times a week.    Yes Historical Provider, MD  Multiple Vitamin (MULTI VITAMIN MENS PO) Take by mouth daily.   Yes Historical Provider, MD  Red Yeast Rice Extract (RED YEAST RICE PO) Take by mouth daily.   Yes Historical Provider, MD  Saw Palmetto, Serenoa repens, (SAW PALMETTO PO) Take by mouth daily.   Yes Historical Provider, MD  terazosin (HYTRIN) 5 MG capsule Take 5 mg by mouth at bedtime.   Yes Historical Provider, MD    Family History  Family History  Problem Relation Age of Onset  . Heart disease Father   . Hyperlipidemia Father   . Stroke Maternal Grandmother   . Heart disease Maternal Grandfather     Social History  Social History   Social History  . Marital Status: Married    Spouse Name: N/A  . Number of Children: N/A  . Years of Education: N/A   Occupational History  . IT professional    Social History Main Topics  . Smoking status: Never Smoker   . Smokeless tobacco: Not on file  . Alcohol Use: Yes     Comment: drink - beer, wine,  and hard liquor  . Drug Use: Not on file  . Sexual Activity: Not on file     Comment: number of sex partners in the last 26 months - 87, birth control method  (pill)   Other Topics Concern  . Not on file   Social History Narrative   Exercise indoor roller hockey 1-2 times per week for 2 hours   [employer]   Married   Physicist, medical          Review of Systems, as per HPI, otherwise negative General:  No chills, fever, night sweats or weight changes.  Cardiovascular:  No chest pain, dyspnea on exertion, edema, orthopnea, palpitations, paroxysmal nocturnal dyspnea. Dermatological: No rash, lesions/masses Respiratory: No cough,  dyspnea Urologic: No hematuria, dysuria Abdominal:   No nausea, vomiting, diarrhea, bright red blood per rectum, melena, or hematemesis Neurologic:  No visual changes, wkns, changes in mental status. All other systems reviewed and are otherwise negative except as noted above.  Physical Exam  Blood pressure 90/60, pulse 82, height 5\' 10"  (1.778 m), weight 174 lb 12.8 oz (79.289 kg).  General: Pleasant, NAD Psych: Normal affect. Neuro: Alert and oriented X 3. Moves all extremities spontaneously. HEENT: Normal  Neck: Supple without bruits or JVD. Lungs:  Resp regular and unlabored, CTA. Heart: RRR no s3, s4, or murmurs. Abdomen: Soft, non-tender, non-distended, BS + x 4.  Extremities: No clubbing, cyanosis or edema. DP/PT/Radials 2+ and equal bilaterally.  Labs:  No results for input(s): CKTOTAL, CKMB, TROPONINI in the last 72 hours. Lab Results  Component Value Date   WBC 4.1 10/28/2015   HGB 16.2 10/28/2015   HCT 47.0 10/28/2015   MCV 87.4 10/28/2015   PLT 222 10/28/2015    No results found for: DDIMER Invalid input(s): POCBNP    Component Value Date/Time   NA 139 10/28/2015 1021   K 4.7 10/28/2015 1021   CL 101 10/28/2015 1021   CO2 29 10/28/2015 1021   GLUCOSE 86 10/28/2015 1021   BUN 17 10/28/2015 1021   CREATININE 0.95 10/28/2015 1021   CREATININE 0.97 03/19/2015 0824   CALCIUM 9.7 10/28/2015 1021   PROT 7.0 10/28/2015 1021   ALBUMIN 4.3 10/28/2015 1021   AST 29 10/28/2015 1021   ALT 56* 10/28/2015 1021   ALKPHOS 74 10/28/2015 1021   BILITOT 1.0 10/28/2015 1021   GFRNONAA 89 10/28/2015 1021   GFRAA >89 10/28/2015 1021   Lab Results  Component Value Date   CHOL 201* 10/28/2015   HDL 59 10/28/2015   LDLCALC 117 10/28/2015   TRIG 127 10/28/2015   Accessory Clinical Findings  Echocardiogram - none  ECG - NSR, normal ECG.   Assessment & Plan  A very pleasant 60 year old gentleman  1. Preventive cardiology with significant FH of premature CAD,  negative treadmill exercise stress for ischemia Calcium score 48, intolerant to statins, we will try to enrol him in PCSK 9 inhibitors trial in the near future.   2. Blood pressure - well controlled  3. Hyperlipidemia - HDL and triglycerides at goal, LDL 117 on zetia, so no benefit, we will discontinue. Intolerant to statins, we will try to enrol him in PCSK 9 inhibitors trial in the near future.   Follow up in 1 year   Dorothy Spark, MD, [facility] 12/24/2015, 9:41 AM

## 2015-12-24 NOTE — Patient Instructions (Signed)
Your physician recommends that you continue on your current medications as directed. Please refer to the Current Medication list given to you today.   Your physician wants you to follow-up in: ONE YEAR WITH DR NELSON You will receive a reminder letter in the mail two months in advance. If you don't receive a letter, please call our office to schedule the follow-up appointment.  

## 2015-12-24 NOTE — Telephone Encounter (Signed)
Left a message for the pt to call back to inform him that Dr Meda Coffee wanted me to inform him that he currently doesn't qualify for PCSK9 inhibitors, the new cholesterol med discussed at today's OV, but we might be able to enroll him in a trial in the very near future.

## 2015-12-25 NOTE — Telephone Encounter (Signed)
Notified the pt that per Dr Meda Coffee, she wanted me to give him a call to inform him that he currently does not qualify for PCSK-9 inhibitors at this time, but we might be able to enroll him in a trial in the near future. Pt verbalized understanding and agrees with this plan.

## 2015-12-25 NOTE — Telephone Encounter (Signed)
Left message for the pt to call back.

## 2015-12-25 NOTE — Telephone Encounter (Signed)
Follow up     Returning a call to the nurse.  Pt is available between 2-3pm and 4-5:30.  Please call

## 2016-01-19 ENCOUNTER — Ambulatory Visit: Payer: 59 | Admitting: Cardiology

## 2016-04-05 ENCOUNTER — Encounter: Payer: Self-pay | Admitting: Family Medicine

## 2016-11-18 ENCOUNTER — Encounter: Payer: 59 | Admitting: Family Medicine

## 2016-12-02 ENCOUNTER — Encounter: Payer: 59 | Admitting: Family Medicine

## 2016-12-15 ENCOUNTER — Ambulatory Visit (INDEPENDENT_AMBULATORY_CARE_PROVIDER_SITE_OTHER): Payer: 59 | Admitting: Family Medicine

## 2016-12-15 ENCOUNTER — Encounter: Payer: Self-pay | Admitting: Family Medicine

## 2016-12-15 VITALS — BP 120/80 | HR 63 | Ht 70.0 in | Wt 179.0 lb

## 2016-12-15 DIAGNOSIS — Z23 Encounter for immunization: Secondary | ICD-10-CM

## 2016-12-15 DIAGNOSIS — E785 Hyperlipidemia, unspecified: Secondary | ICD-10-CM | POA: Diagnosis not present

## 2016-12-15 DIAGNOSIS — I2510 Atherosclerotic heart disease of native coronary artery without angina pectoris: Secondary | ICD-10-CM

## 2016-12-15 DIAGNOSIS — M8580 Other specified disorders of bone density and structure, unspecified site: Secondary | ICD-10-CM

## 2016-12-15 DIAGNOSIS — N401 Enlarged prostate with lower urinary tract symptoms: Secondary | ICD-10-CM | POA: Diagnosis not present

## 2016-12-15 DIAGNOSIS — Z8249 Family history of ischemic heart disease and other diseases of the circulatory system: Secondary | ICD-10-CM | POA: Diagnosis not present

## 2016-12-15 DIAGNOSIS — E663 Overweight: Secondary | ICD-10-CM | POA: Insufficient documentation

## 2016-12-15 DIAGNOSIS — Z0000 Encounter for general adult medical examination without abnormal findings: Secondary | ICD-10-CM

## 2016-12-15 DIAGNOSIS — R351 Nocturia: Secondary | ICD-10-CM

## 2016-12-15 DIAGNOSIS — I2583 Coronary atherosclerosis due to lipid rich plaque: Secondary | ICD-10-CM

## 2016-12-15 LAB — CBC WITH DIFFERENTIAL/PLATELET
BASOS PCT: 1 %
Basophils Absolute: 42 cells/uL (ref 0–200)
EOS PCT: 4 %
Eosinophils Absolute: 168 cells/uL (ref 15–500)
HEMATOCRIT: 46.1 % (ref 38.5–50.0)
Hemoglobin: 15.7 g/dL (ref 13.2–17.1)
LYMPHS PCT: 28 %
Lymphs Abs: 1176 cells/uL (ref 850–3900)
MCH: 29.9 pg (ref 27.0–33.0)
MCHC: 34.1 g/dL (ref 32.0–36.0)
MCV: 87.8 fL (ref 80.0–100.0)
MONO ABS: 378 {cells}/uL (ref 200–950)
MPV: 10.4 fL (ref 7.5–12.5)
Monocytes Relative: 9 %
Neutro Abs: 2436 cells/uL (ref 1500–7800)
Neutrophils Relative %: 58 %
PLATELETS: 211 10*3/uL (ref 140–400)
RBC: 5.25 MIL/uL (ref 4.20–5.80)
RDW: 13.2 % (ref 11.0–15.0)
WBC: 4.2 10*3/uL (ref 3.8–10.8)

## 2016-12-15 LAB — POCT GLYCOSYLATED HEMOGLOBIN (HGB A1C): Hemoglobin A1C: 5.5

## 2016-12-15 NOTE — Assessment & Plan Note (Deleted)
Dr. Wynetta Emery Bordon--->  Goes twice yrly for Urology Exams

## 2016-12-15 NOTE — Assessment & Plan Note (Addendum)
For several yrs now patient has had problems with his lipids --->   - Cards started him on low dose statin to start and pt was intol to it.   - Patient prefers to let Cards decided on next best steps in re: chol control   - diet and exercise advised; encouraged to hit a AHA guidelines.  Handouts given to patient today to [facility] him on low saturated and Transfats diet.  Mediterranean diet also discussed and handouts provided.

## 2016-12-15 NOTE — Assessment & Plan Note (Signed)
Dad- passed age 58 AMI-->   Mom- healthy

## 2016-12-15 NOTE — Patient Instructions (Addendum)
 Please realize, EXERCISE IS MEDICINE!  -  American Heart Association ( AHA) guidelines for exercise : If you are in good health, without any medical conditions, you should engage in 150 minutes of moderate intensity aerobic activity per week.  This means you should be huffing and puffing throughout your workout.   Engaging in regular exercise will improve brain function and memory, as well as improve mood, boost immune system and help with weight management.  As well as the other, more well-known effects of exercise such as decreasing blood sugar levels, decreasing blood pressure,  and decreasing bad cholesterol levels/ increasing good cholesterol levels.     -  The AHA strongly endorses consumption of a diet that contains a variety of foods from all the food categories with an emphasis on fruits and vegetables; fat-free and low-fat dairy products; cereal and grain products; legumes and nuts; and fish, poultry, and/or extra lean meats.    Excessive food intake, especially of foods high in saturated and trans fats, sugar, and salt, should be avoided.    Adequate water intake of roughly 1/2 of your weight in pounds, should equal the ounces of water per day you should drink.  So for instance, if you're 200 pounds, that would be 100 ounces of water per day.         Mediterranean Diet  Why follow it? Research shows. . Those who follow the Mediterranean diet have a reduced risk of heart disease  . The diet is associated with a reduced incidence of Parkinson's and Alzheimer's diseases . People following the diet may have longer life expectancies and lower rates of chronic diseases  . The Dietary Guidelines for Americans recommends the Mediterranean diet as an eating plan to promote health and prevent disease  What Is the Mediterranean Diet?  . Healthy eating plan based on typical foods and recipes of Mediterranean-style cooking . The diet is primarily a plant based diet; these foods should make up a  majority of meals   Starches - Plant based foods should make up a majority of meals - They are an important sources of vitamins, minerals, energy, antioxidants, and fiber - Choose whole grains, foods high in fiber and minimally processed items  - Typical grain sources include wheat, oats, barley, corn, brown rice, bulgar, farro, millet, polenta, couscous  - Various types of beans include chickpeas, lentils, fava beans, black beans, white beans   Fruits  Veggies - Large quantities of antioxidant rich fruits & veggies; 6 or more servings  - Vegetables can be eaten raw or lightly drizzled with oil and cooked  - Vegetables common to the traditional Mediterranean Diet include: artichokes, arugula, beets, broccoli, brussel sprouts, cabbage, carrots, celery, collard greens, cucumbers, eggplant, kale, leeks, lemons, lettuce, mushrooms, okra, onions, peas, peppers, potatoes, pumpkin, radishes, rutabaga, shallots, spinach, sweet potatoes, turnips, zucchini - Fruits common to the Mediterranean Diet include: apples, apricots, avocados, cherries, clementines, dates, figs, grapefruits, grapes, melons, nectarines, oranges, peaches, pears, pomegranates, strawberries, tangerines  Fats - Replace butter and margarine with healthy oils, such as olive oil, canola oil, and tahini  - Limit nuts to no more than a handful a day  - Nuts include walnuts, almonds, pecans, pistachios, pine nuts  - Limit or avoid candied, honey roasted or heavily salted nuts - Olives are central to the Mediterranean diet - can be eaten whole or used in a variety of dishes   Meats Protein - Limiting red meat: no more than a few times a   month - When eating red meat: choose lean cuts and keep the portion to the size of deck of cards - Eggs: approx. 0 to 4 times a week  - Fish and lean poultry: at least 2 a week  - Healthy protein sources include, chicken, turkey, lean beef, lamb - Increase intake of seafood such as tuna, salmon, trout,  mackerel, shrimp, scallops - Avoid or limit high fat processed meats such as sausage and bacon  Dairy - Include moderate amounts of low fat dairy products  - Focus on healthy dairy such as fat free yogurt, skim milk, low or reduced fat cheese - Limit dairy products higher in fat such as whole or 2% milk, cheese, ice cream  Alcohol - Moderate amounts of red wine is ok  - No more than 5 oz daily for women (all ages) and men older than age 65  - No more than 10 oz of wine daily for men younger than 65  Other - Limit sweets and other desserts  - Use herbs and spices instead of salt to flavor foods  - Herbs and spices common to the traditional Mediterranean Diet include: basil, bay leaves, chives, cloves, cumin, fennel, garlic, lavender, marjoram, mint, oregano, parsley, pepper, rosemary, sage, savory, sumac, tarragon, thyme   It's not just a diet, it's a lifestyle:  . The Mediterranean diet includes lifestyle factors typical of those in the region  . Foods, drinks and meals are best eaten with others and savored . Daily physical activity is important for overall good health . This could be strenuous exercise like running and aerobics . This could also be more leisurely activities such as walking, housework, yard-work, or taking the stairs . Moderation is the key; a balanced and healthy diet accommodates most foods and drinks . Consider portion sizes and frequency of consumption of certain foods   Meal Ideas & Options:  . Breakfast:  o Whole wheat toast or whole wheat English muffins with peanut butter & hard boiled egg o Steel cut oats topped with apples & cinnamon and skim milk  o Fresh fruit: banana, strawberries, melon, berries, peaches  o Smoothies: strawberries, bananas, greek yogurt, peanut butter o Low fat greek yogurt with blueberries and granola  o Egg white omelet with spinach and mushrooms o Breakfast couscous: whole wheat couscous, apricots, skim milk, cranberries  . Sandwiches:   o Hummus and grilled vegetables (peppers, zucchini, squash) on whole wheat bread   o Grilled chicken on whole wheat pita with lettuce, tomatoes, cucumbers or tzatziki  o Tuna salad on whole wheat bread: tuna salad made with greek yogurt, olives, red peppers, capers, green onions o Garlic rosemary lamb pita: lamb sauted with garlic, rosemary, salt & pepper; add lettuce, cucumber, greek yogurt to pita - flavor with lemon juice and black pepper  . Seafood:  o Mediterranean grilled salmon, seasoned with garlic, basil, parsley, lemon juice and black pepper o Shrimp, lemon, and spinach whole-grain pasta salad made with low fat greek yogurt  o Seared scallops with lemon orzo  o Seared tuna steaks seasoned salt, pepper, coriander topped with tomato mixture of olives, tomatoes, olive oil, minced garlic, parsley, green onions and cappers  . Meats:  o Herbed greek chicken salad with kalamata olives, cucumber, feta  o Red bell peppers stuffed with spinach, bulgur, lean ground beef (or lentils) & topped with feta   o Kebabs: skewers of chicken, tomatoes, onions, zucchini, squash  o Turkey burgers: made with red onions, mint, dill, lemon   juice, feta cheese topped with roasted red peppers . Vegetarian o Cucumber salad: cucumbers, artichoke hearts, celery, red onion, feta cheese, tossed in olive oil & lemon juice  o Hummus and whole grain pita points with a greek salad (lettuce, tomato, feta, olives, cucumbers, red onion) o Lentil soup with celery, carrots made with vegetable broth, garlic, salt and pepper  o Tabouli salad: parsley, bulgur, mint, scallions, cucumbers, tomato, radishes, lemon juice, olive oil, salt and pepper.   Guidelines for a Low Cholesterol, Low Saturated Fat Diet   Fats - Limit total intake of fats and oils. - Avoid butter, stick margarine, shortening, lard, palm and coconut oils. - Limit mayonnaise, salad dressings, gravies and sauces, unless they are homemade with low-fat  ingredients. - Limit chocolate. - Choose low-fat and nonfat products, such as low-fat mayonnaise, low-fat or non-hydrogenated peanut butter, low-fat or fat-free salad dressings and nonfat gravy. - Use vegetable oil, such as canola or olive oil. - Look for margarine that does not contain trans fatty acids. - Use nuts in moderate amounts. - Read ingredient labels carefully to determine both amount and type of fat present in foods. Limit saturated and trans fats! - Avoid high-fat processed and convenience foods.  Meats and Meat Alternatives - Choose fish, chicken, [place] and lean meats. - Use dried beans, peas, lentils and tofu. - Limit egg yolks to three to four per week. - If you eat red meat, limit to no more than three servings per week and choose loin or round cuts. - Avoid fatty meats, such as bacon, sausage, franks, luncheon meats and ribs. - Avoid all organ meats, including liver.  Dairy - Choose nonfat or low-fat milk, yogurt and cottage cheese. - Most cheeses are high in fat. Choose cheeses made from non-fat milk, such as mozzarella and ricotta cheese. - Choose light or fat-free cream cheese and sour cream. - Avoid cream and sauces made with cream.  Fruits and Vegetables - Eat a wide variety of fruits and vegetables. - Use lemon juice, vinegar or "mist" olive oil on vegetables. - Avoid adding sauces, fat or oil to vegetables.  Breads, Cereals and Grains - Choose whole-grain breads, cereals, pastas and rice. - Avoid high-fat snack foods, such as granola, cookies, pies, pastries, doughnuts and croissants.  Cooking Tips - Avoid deep [place] foods. - Trim visible fat off meats and remove skin from poultry before cooking. - Bake, broil, boil, poach or roast poultry, fish and lean meats. - Drain and discard fat that drains out of meat as you cook it. - Add little or no fat to foods. - Use vegetable oil sprays to grease pans for cooking or baking. - Steam vegetables. - Use herbs  or no-oil marinades to flavor foods.    The quick and dirty--> lower triglyceride levels more through...  1) - Beware of bad fats: Cutting back on saturated fat (in red meat and full-fat dairy foods) and trans fats (in restaurant [place] foods and commercially prepared baked goods) can lower triglycerides.  2) - Go for good carbs: Easily digested carbohydrates (such as white bread, white rice, cornflakes, and sugary sodas) give triglycerides a definite boost.   3) - Eating whole grains and cutting back on soda can help control triglycerides.  4) - Check your alcohol use. In some people, alcohol dramatically boosts triglycerides. The only way to know if this is true for you is to avoid alcohol for a few weeks and have your triglycerides tested again.  5) - Go  fish. Omega-3 fats in salmon, tuna, sardines, and other fatty fish can lower triglycerides. Having fish twice a week is fine.  6) - Aim for a healthy weight. If you are overweight, losing just 5% to 10% of your weight can help drive down triglycerides.  7) - Get moving. Exercise lowers triglycerides and boosts heart-healthy HDL cholesterol.  8) - quit smoking if you do    For more detailed info----->   For those diagnosed with high triglycerides, it's important to take action to lower your levels and improve your heart health.  Triglyceride is just a fancy word for fat - the fat in our bodies is stored in the form of triglycerides. Triglycerides are found in foods and manufactured in our bodies.  Normal triglyceride levels are defined as less than 150 mg/dL; 150 to 199 is considered borderline high; 200 to 499 is high; and 500 or higher is officially called very high. To me, anything over 150 is a red flag indicating my patient needs to take immediate steps to get the situation under control.   What is the significance of high triglycerides? High triglyceride levels make blood thicker and stickier, which means that it is more likely  to form clots. Studies have shown that triglyceride levels are associated with increased risks of cardiovascular disease and stroke - in both men and women - alone or in combination with other risk factors (high triglycerides combined with high LDL cholesterol can be a particularly deadly combination). For example, in one ground-breaking study, high triglycerides alone increased the risk of cardiovascular disease by 14 percent in men, and by 100 percent in women. But when the test subjects also had low HDL cholesterol (that's the good cholesterol) and other risk factors, high triglycerides increased the risk of disease by 32 percent in men and 76 percent in women.   Fortunately, triglycerides can sometimes be controlled with several diet and lifestyle changes.    What Factors Can Increase Triglycerides? As with cholesterol, eating too much of the wrong kinds of fats will raise your blood triglycerides.  Therefore, it's important to restrict the amounts of saturated fats and trans fats you allow into your diet.  Triglyceride levels can also shoot up after eating foods that are high in carbohydrates or after drinking alcohol.  That's why triglyceride blood tests require an overnight fast.  If you have elevated triglycerides, it's especially important to avoid sugary and refined carbohydrates, including sugar, honey, and other sweeteners, soda and other sugary drinks, candy, baked goods, and anything made with white (refined or enriched) flour, including white bread, rolls, cereals, buns, pastries, regular pasta, and white rice.  You'll also want to limit dried fruit and fruit juice since they're dense in simple sugar.  All of these low-quality carbs cause a sudden rise in insulin, which may lead to a spike in triglycerides.  Triglycerides can also become elevated as a reaction to having diabetes, hypothyroidism, or kidney disease. As with most other heart-related factors, being overweight and inactive also  contribute to abnormal triglycerides. And unfortunately, some people have a genetic predisposition that causes them to manufacture way too much triglycerides on their own, no matter how carefully they eat.   How Can You Lower Your Triglyceride Levels? If you are diagnosed with high triglycerides, it's important to take action. There are several things you can do to help lower your triglyceride levels and improve your heart health:  Lose weight if you are overweight.  There is a clear correlation  between obesity and high triglycerides - the heavier people are, the higher their triglyceride levels are likely to be. The good news is that losing weight can significantly lower triglycerides. In a large study of individuals with type 2 diabetes, those assigned to the "lifestyle intervention group" - which involved counseling, a low-calorie meal plan, and customized exercise program - lost 8.6% of their body weight and lowered their triglyceride levels by more than 16%. If you're overweight, find a weight loss plan that works for you and commit to shedding the pounds and getting healthier.  Reduce the amount of saturated fat and trans fat in your diet.  Start by avoiding or dramatically limiting butter, cream cheese, lard, sour cream, doughnuts, cakes, cookies, candy bars, regular ice cream, R[place] foods, pizza, cheese sauce, cream-based sauces and salad dressings, high-fat meats (including fatty hamburgers, bologna, pepperoni, sausage, bacon, salami, pastrami, spareribs, and hot dogs), high-fat cuts of beef and pork, and whole-milk dairy products.   Other ways to cut back: Choose lean meats only (including skinless chicken and [place], lean beef, lean pork), fish, and reduced-fat or fat-free dairy products.   Experiment with adding whole soy foods to your diet. Although soy itself may not reduce risk of heart disease, it replaces hazardous animal fats with healthier proteins. Choose high-quality soy foods,  such as tofu, tempeh, soy milk, and edamame (whole soybeans).  Always remove skin from poultry.  Prepare foods by baking, roasting, broiling, boiling, poaching, steaming, grilling, or stir-frying in vegetable oil.  Most stick margarines contain trans fats, and trans fats are also found in some packaged baked goods, potato chips, snack foods, [place] foods, and fast food that use or create hydrogenated oils.    (All food labels must now list the amount of trans fats, right after the amount of saturated fats - good news for consumers. As a result, many food companies have now reformulated their products to be trans fat free.many, but not all! So it's still just as important to read labels and make sure the packaged foods you buy don't contain trans fats.)     If you use margarine, purchase soft-tub margarine spreads that contain 0 grams trans fats and don't list any partially hydrogenated oils in the ingredients list. By substituting olive oil or vegetable oil for trans fats in just 2 percent of your daily calories, you can reduce your risk of heart disease by 53 percent.   There is no safe amount of trans fats, so try to keep them as far from your plate as possible.  Avoid foods that are concentrated in sugar (even dried fruit and fruit juice). Sugary foods can elevate triglyceride levels in the blood, so keep them to a bare minimum.  Swap out refined carbohydrates for whole grains.  Refined carbohydrates - like white rice, regular pasta, and anything made with white or "enriched" flour (including white bread, rolls, cereals, buns, and crackers) - raise blood sugar and insulin levels more than fiber-rich whole grains. Higher insulin levels, in turn, can lead to a higher rise in triglycerides after a meal. So, make the switch to whole wheat bread, whole grain pasta, brown or wild rice, and whole grain versions of cereals, crackers, and other bread products. However, it's important to know that individuals  with high triglycerides should moderate even their intake of high-quality starches (since all starches raise blood sugar) - I recommend 1 to 2 servings per meal.  Cut way back on alcohol.  If you have high triglycerides,  alcohol should be considered a rare treat - if you indulge at all, since even small amounts of alcohol can dramatically increase triglyceride levels.  Incorporate omega-3 fats.  Heart-healthy fish oils are especially rich in omega-3 fatty acids. In multiple studies over the past two decades, people who ate diets high in omega-3s had 30 to 40 percent reductions in heart disease. Although we don't yet know why fish oil works so well, there are several possibilities. Omega-3s seem to reduce inflammation, reduce high blood pressure, decrease triglycerides, raise HDL cholesterol, and make blood thinner and less sticky so it is less likely to clot. It's as close to a food prescription for heart health as it gets. If you have high triglycerides, I recommend eating at least three servings of one of the omega-3-rich fish every week (fatty fish is the most concentrated food form of omega three fats). If you cannot manage to eat that much fish, speak with your physician about taking fish oil capsules, which offer similar benefits.The best foods for omega-3 fatty acids include wild salmon (fresh, canned), herring, mackerel (not king), sardines, anchovies, rainbow trout, and Pacific oysters. Non-fish sources of omega-3 fats include omega-3-fortified eggs, ground flaxseed, chia seeds, walnuts, butternuts (white walnuts), seaweed, walnut oil, canola oil, and soybeans.  Quit smoking.  Smoking causes inflammation, not just in your lungs, but throughout your body. Inflammation can contribute to atherosclerosis, blood clots, and risk of heart attack. Smoking makes all heart health indicators worse. If you have high cholesterol, high triglycerides, or high blood pressure, smoking magnifies the  danger.  Become more physically active.  Even moderate exercise can help improve cholesterol, triglycerides, and blood pressure. Aerobic exercise seems to be able to stop the sharp rise of triglycerides after eating, perhaps because of a decrease in the amount of triglyceride released by the liver, or because active muscle clears triglycerides out of the blood stream more quickly than inactive muscle. If you haven't exercised regularly (or at all) for years, I recommend starting slowly, by walking at an easy pace for 15 minutes a day. Then, as you feel more comfortable, increase the amount. Your ultimate goal should be at least 30 minutes of moderate physical activity, at least five days a week.

## 2016-12-15 NOTE — Progress Notes (Signed)
New patient office visit note:  Impression and Recommendations:    1. Hyperlipidemia LDL goal <100   2. FH: CAD (coronary artery disease)   3. Benign prostatic hyperplasia with nocturia   4. Osteopenia, unspecified location   5. Coronary artery disease due to lipid rich plaque   6. Need for influenza vaccination   7. Routine physical examination   8. Slightly Overweight (BMI 25.0-29.9)     Hyperlipidemia LDL goal <100 For several yrs now patient has had problems with his lipids --->   - Cards started him on low dose statin to start and pt was intol to it.   - Patient prefers to let Cards decided on next best steps in re: chol control   - diet and exercise advised; encouraged to hit a AHA guidelines.  Handouts given to patient today to [facility] him on low saturated and Transfats diet.  Mediterranean diet also discussed and handouts provided.  FH: CAD (coronary artery disease) Dad- passed age 72 AMI-->   Mom- healthy  Benign prostatic hyperplasia Medications per urology.  Symptoms well controlled currently.  No complaints.     Orders Placed This Encounter  Procedures  . Flu Vaccine QUAD 36+ mos IM (Fluarix & Fluzone Quad PF  . CBC with Differential/Platelet  . COMPLETE METABOLIC PANEL WITH GFR  . Hepatitis C antibody  . HIV antibody  . Lipid panel  . TSH  . VITAMIN D 25 Hydroxy (Vit-D Deficiency, Fractures)  . POCT HgB A1C     New Prescriptions   VITAMIN D, ERGOCALCIFEROL, (DRISDOL) 50000 UNITS CAPS CAPSULE    Take 1 capsule (50,000 Units total) by mouth every 7 (seven) days.     Modified Medications   No medications on file     Discontinued Medications   EZETIMIBE (ZETIA) 10 MG TABLET    Take 1 tablet (10 mg total) by mouth daily.     The patient was counseled, risk factors were discussed, anticipatory guidance given.  Gross side effects, risk and benefits, and alternatives of medications discussed with patient.  Patient is aware that all  medications have potential side effects and we are unable to predict every side effect or drug-drug interaction that may occur.  Expresses verbal understanding and consents to current therapy plan and treatment regimen.  Return in about 6 months (around 06/15/2017) for Exercise, diet, weight loss, HLD, BPH.  Please see AVS handed out to patient at the end of our visit for further patient instructions/ counseling done pertaining to today's office visit.    Note: This document was prepared using Dragon voice recognition software and may include unintentional dictation errors.  ----------------------------------------------------------------------------------------------------------------------    Subjective:    Chief Complaint  Patient presents with  . Establish Care    HPI: Edward Medina is a pleasant 60 y.o. male who presents to [place] [facility] today to review their medical history with me and establish care.   I asked the patient to review their chronic problem list with me to ensure everything was updated and accurate.    Patient is a Hotel manager in reporting and analytic's; [employer] 15 years.   He is married Levada Dy, 2 children ages 83 and 54.  Patient lives at home with his wife Levada Dy and mother-in-law in the winter only.  Patient has never smoked, drinks 1-2 per week, is not sexually active.  Does not worry about STDs  Exercises approximately 150 minutes per  week of doing roller hockey.  Family history coronary artery disease:   Father deceased age 65 of coronary artery disease.  He had high cholesterol.;  Sister has some type of autoimmune inflammatory vasculitis.  HLD:  Intol to statins---> cards doc told him to work on diet/ exercise.    Dr Meda Coffee- Cards Doc--> sees once yrly.      Patient has BPH:  Sx well controlled on meds.  Has a urologist Dr. Harvie Bridge.     Wt Readings from Last 3 Encounters:  12/15/16 179 lb (81.2 kg)    12/24/15 174 lb 12.8 oz (79.3 kg)  10/28/15 171 lb 3.2 oz (77.7 kg)   BP Readings from Last 3 Encounters:  12/15/16 120/80  12/24/15 90/60  10/28/15 112/76   Pulse Readings from Last 3 Encounters:  12/15/16 63  12/24/15 82  10/28/15 64   BMI Readings from Last 3 Encounters:  12/15/16 25.68 kg/m  12/24/15 25.08 kg/m  10/28/15 25.65 kg/m    Patient Care Team    Relationship Specialty Notifications Start End  Mellody Dance, DO PCP - General Family Medicine  12/15/16   Dorothy Spark, MD Consulting Physician Cardiology  12/15/16   Iran Planas, MD Consulting Physician Orthopedic Surgery  12/15/16   Ronald Lobo, MD Consulting Physician Gastroenterology  12/15/16   Raynelle Bring, MD Consulting Physician Urology  12/15/16     Patient Active Problem List   Diagnosis Date Noted  . Hyperlipidemia LDL goal <100 01/01/2015    Priority: High  . Benign prostatic hyperplasia 12/15/2012    Priority: Medium  . FH: CAD (coronary artery disease) 12/24/2015    Priority: Low  . Slightly Overweight (BMI 25.0-29.9) 12/15/2016  . Coronary artery disease due to lipid rich plaque 12/24/2015  . Osteopenia 11/23/2013     History reviewed. No pertinent past medical history.   History reviewed. No pertinent past medical history.   Past Surgical History:  Procedure Laterality Date  . HERNIA REPAIR  2005  . TONSILLECTOMY  1967  . WISDOM TOOTH EXTRACTION  1976     Family History  Problem Relation Age of Onset  . Heart disease Father   . Hyperlipidemia Father   . Heart attack Father   . Stroke Maternal Grandmother   . Heart disease Maternal Grandfather      History  Drug Use No    History  Alcohol Use  . 1.2 oz/week  . 1 Cans of beer, 1 Glasses of wine per week    Comment: drink - beer, wine, and hard liquor    History  Smoking Status  . Never Smoker  Smokeless Tobacco  . Never Used     Patient's Medications  New Prescriptions   VITAMIN D,  ERGOCALCIFEROL, (DRISDOL) 50000 UNITS CAPS CAPSULE    Take 1 capsule (50,000 Units total) by mouth every 7 (seven) days.  Previous Medications   ALUMINUM CHLORIDE (DRYSOL) 20 % EXTERNAL SOLUTION    Apply topically at bedtime.   ASPIRIN 81 MG TABLET    Take 81 mg by mouth daily.   FINASTERIDE (PROSCAR) 5 MG TABLET    Take 1 tablet by mouth daily.   FISH OIL-OMEGA-3 FATTY ACIDS 1000 MG CAPSULE    Take 2 g by mouth 3 (three) times a week.    MULTIPLE VITAMIN (MULTI VITAMIN MENS PO)    Take by mouth daily.   SAW PALMETTO, SERENOA REPENS, (SAW PALMETTO PO)    Take 1 tablet by mouth daily.  TERAZOSIN (HYTRIN) 5 MG CAPSULE    Take 5 mg by mouth at bedtime.  Modified Medications   No medications on file  Discontinued Medications   EZETIMIBE (ZETIA) 10 MG TABLET    Take 1 tablet (10 mg total) by mouth daily.    Allergies: Atorvastatin; Pravastatin; Simvastatin; and Amoxicillin  Review of Systems  Constitutional: Negative for chills, diaphoresis, fever, malaise/fatigue and weight loss.  HENT: Positive for tinnitus. Negative for congestion and sore throat.   Eyes: Negative for blurred vision, double vision and photophobia.  Respiratory: Negative for cough, shortness of breath and wheezing.   Cardiovascular: Negative for chest pain and palpitations.  Gastrointestinal: Negative for blood in stool, diarrhea, nausea and vomiting.  Genitourinary: Negative for dysuria, frequency and urgency.  Musculoskeletal: Positive for joint pain. Negative for myalgias.  Skin: Negative for itching and rash.  Neurological: Negative for dizziness, focal weakness, weakness and headaches.  Endo/Heme/Allergies: Negative for environmental allergies and polydipsia. Bruises/bleeds easily.  Psychiatric/Behavioral: Negative for depression, memory loss and suicidal ideas. The patient is not nervous/anxious and does not have insomnia.      Objective:    Blood pressure 120/80, pulse 63, height 5\' 10"  (1.778 m), weight 179  lb (81.2 kg), SpO2 96 %. Body mass index is 25.68 kg/m. General: Well Developed, well nourished, and in no acute distress.  Neuro: Alert and oriented x3, extra-ocular muscles intact, sensation grossly intact.  HEENT: Normocephalic, atraumatic, pupils equal round reactive to light, neck supple, no bruit Skin: no gross rashes  Cardiac: Regular rate and rhythm Respiratory: Essentially clear to auscultation bilaterally. Not using accessory muscles, speaking in full sentences.  Abdominal: not grossly distended Musculoskeletal: Ambulates w/o diff, FROM * 4 ext.  Vasc: less 2 sec cap RF, warm and pink  Psych:  No HI/SI, judgement and insight good, Euthymic mood. Full Affect.

## 2016-12-16 LAB — HEPATITIS C ANTIBODY: HCV Ab: NEGATIVE

## 2016-12-16 LAB — COMPLETE METABOLIC PANEL WITH GFR
ALT: 56 U/L — AB (ref 9–46)
AST: 35 U/L (ref 10–35)
Albumin: 4.3 g/dL (ref 3.6–5.1)
Alkaline Phosphatase: 76 U/L (ref 40–115)
BUN: 17 mg/dL (ref 7–25)
CHLORIDE: 103 mmol/L (ref 98–110)
CO2: 26 mmol/L (ref 20–31)
CREATININE: 0.98 mg/dL (ref 0.70–1.33)
Calcium: 9.4 mg/dL (ref 8.6–10.3)
GFR, Est African American: >89 mL/min (ref >=60)
GFR, Est Non African American: 85 mL/min (ref >=60)
GLUCOSE: 85 mg/dL (ref 65–99)
POTASSIUM: 4 mmol/L (ref 3.5–5.3)
SODIUM: 138 mmol/L (ref 135–146)
Total Bilirubin: 0.9 mg/dL (ref 0.2–1.2)
Total Protein: 6.8 g/dL (ref 6.1–8.1)

## 2016-12-16 LAB — LIPID PANEL
Cholesterol: 241 mg/dL — ABNORMAL HIGH (ref <200)
HDL: 56 mg/dL (ref >40)
LDL CALC: 158 mg/dL — AB (ref <100)
TRIGLYCERIDES: 133 mg/dL (ref <150)
Total CHOL/HDL Ratio: 4.3 Ratio (ref <5.0)
VLDL: 27 mg/dL (ref <30)

## 2016-12-16 LAB — VITAMIN D 25 HYDROXY (VIT D DEFICIENCY, FRACTURES): Vit D, 25-Hydroxy: 34 ng/mL (ref 30–100)

## 2016-12-16 LAB — HIV ANTIBODY (ROUTINE TESTING W REFLEX): HIV 1&2 Ab, 4th Generation: NONREACTIVE

## 2016-12-16 LAB — TSH: TSH: 1.05 mIU/L (ref 0.40–4.50)

## 2016-12-27 ENCOUNTER — Telehealth: Payer: Self-pay | Admitting: Family Medicine

## 2016-12-27 NOTE — Telephone Encounter (Signed)
Pt's wife cld states he need an RX for weekly Vitamin D sent to [facility]--glh

## 2016-12-27 NOTE — Telephone Encounter (Signed)
Okay 

## 2016-12-27 NOTE — Telephone Encounter (Signed)
Dr Raliegh Scarlet did recommend Vitamin D 5,000 IU daily. His wife states he can get the prescription strength for free on a 90 day prescription through [place]. Please advise.

## 2016-12-30 ENCOUNTER — Other Ambulatory Visit: Payer: Self-pay

## 2016-12-30 MED ORDER — VITAMIN D (ERGOCALCIFEROL) 1.25 MG (50000 UNIT) PO CAPS: 50000.0000 [IU] | ORAL_CAPSULE | ORAL | 1 refills | Status: DC

## 2016-12-30 NOTE — Telephone Encounter (Signed)
LVM informing pt that Vitamin D 50,000 IU 12 pills with 1 refill  was called into [facility]. Pt advised to take 1 cap once a week per Dr. Raliegh Scarlet.

## 2016-12-31 DIAGNOSIS — K5730 Diverticulosis of large intestine without perforation or abscess without bleeding: Secondary | ICD-10-CM | POA: Diagnosis not present

## 2016-12-31 DIAGNOSIS — Z86010 Personal history of colonic polyps: Secondary | ICD-10-CM | POA: Diagnosis not present

## 2016-12-31 LAB — HM COLONOSCOPY

## 2017-01-09 NOTE — Assessment & Plan Note (Signed)
Medications per urology.  Symptoms well controlled currently.  No complaints.

## 2017-01-19 DIAGNOSIS — Z23 Encounter for immunization: Secondary | ICD-10-CM | POA: Diagnosis not present

## 2017-02-10 ENCOUNTER — Encounter: Payer: Self-pay | Admitting: Cardiology

## 2017-02-21 ENCOUNTER — Encounter: Payer: Self-pay | Admitting: Cardiology

## 2017-02-21 ENCOUNTER — Ambulatory Visit (INDEPENDENT_AMBULATORY_CARE_PROVIDER_SITE_OTHER): Payer: 59 | Admitting: Cardiology

## 2017-02-21 VITALS — BP 122/76 | HR 64 | Ht 70.0 in | Wt 174.0 lb

## 2017-02-21 DIAGNOSIS — E784 Other hyperlipidemia: Secondary | ICD-10-CM | POA: Diagnosis not present

## 2017-02-21 DIAGNOSIS — I2510 Atherosclerotic heart disease of native coronary artery without angina pectoris: Secondary | ICD-10-CM | POA: Diagnosis not present

## 2017-02-21 DIAGNOSIS — E785 Hyperlipidemia, unspecified: Secondary | ICD-10-CM | POA: Diagnosis not present

## 2017-02-21 DIAGNOSIS — Z8249 Family history of ischemic heart disease and other diseases of the circulatory system: Secondary | ICD-10-CM

## 2017-02-21 DIAGNOSIS — I2583 Coronary atherosclerosis due to lipid rich plaque: Secondary | ICD-10-CM | POA: Diagnosis not present

## 2017-02-21 DIAGNOSIS — E7849 Other hyperlipidemia: Secondary | ICD-10-CM

## 2017-02-21 DIAGNOSIS — Z789 Other specified health status: Secondary | ICD-10-CM | POA: Diagnosis not present

## 2017-02-21 NOTE — Patient Instructions (Signed)

## 2017-02-21 NOTE — Progress Notes (Signed)
Patient ID: Edward Medina, male   DOB: November 19, 1956, 61 y.o.   MRN: [id number]   Patient Name: Edward Medina Date of Encounter: 02/21/2017  Primary Care Provider:  Mellody Dance, DO Primary Cardiologist: Ena Dawley  Problem List   No past medical history on file. Past Surgical History:  Procedure Laterality Date  . HERNIA REPAIR  2005  . TONSILLECTOMY  1967  . WISDOM TOOTH EXTRACTION  1976   Allergies  Allergies  Allergen Reactions  . Atorvastatin Other (See Comments)    Memory impairment  . Pravastatin Other (See Comments)    Memory loss/impairment  . Simvastatin Other (See Comments)    MADE HIM FEEL DISORIENTED  . Amoxicillin Rash    SEVERE   HPI  61 year old gentleman with past medical history of hyperlipidemia and family history of premature coronary artery disease. Patient's main concern is that his father died of massive heart attack at age of 59. The patient himself was diagnosed with hyperlipidemia few years ago and for a while taking simvastatin that lead to confusion where he lost his way on her way to work. He is currently taking Fish all and red yeast rice without any side effects. The patient used to have active job however now he works from home and states that his activity level has significantly decreased. He plays floor hockey every Wednesday. He would occasionally feel short of breath especially after a long period of inactivity. He denies any palpitations or syncope. No chest pain, lower extremity edema, claudications or orthopnea. He sticks to healthy diet most of the time.  The patient is following after 6 months, he feels great, exercises on a regular basis and denies any symptoms. He had a calcium score done with 48 in LAD. Started on statins - 3 different ones that led to confusion - significant like not being able to find his way to work. No claudications, DOE, syncope.  02/21/2017, the patient has been doing great, he just returned from a three-week  trip to [place] where his daughter is studying. He developed order static hypotension when walking to the bathroom at night and fell on his side of the head resulting in a laceration requiring 19 stitches. He denies any headache blurry vision any strokelike symptoms, they have never performed head CT. He has been really good with his diet and continues to exercise however his lipids remain elevated. He denies any chest pain or shortness of breath.  Home Medications  Prior to Admission medications   Medication Sig Start Date End Date Taking? Authorizing Provider  aluminum chloride (DRYSOL) 20 % external solution Apply topically at bedtime.   Yes Historical Provider, MD  aspirin 81 MG tablet Take 81 mg by mouth daily.   Yes Historical Provider, MD  fish oil-omega-3 fatty acids 1000 MG capsule Take 2 g by mouth 3 (three) times a week.    Yes Historical Provider, MD  Multiple Vitamin (MULTI VITAMIN MENS PO) Take by mouth daily.   Yes Historical Provider, MD  Red Yeast Rice Extract (RED YEAST RICE PO) Take by mouth daily.   Yes Historical Provider, MD  Saw Palmetto, Serenoa repens, (SAW PALMETTO PO) Take by mouth daily.   Yes Historical Provider, MD  terazosin (HYTRIN) 5 MG capsule Take 5 mg by mouth at bedtime.   Yes Historical Provider, MD    Family History  Family History  Problem Relation Age of Onset  . Heart disease Father   . Hyperlipidemia Father   . Heart  attack Father   . Stroke Maternal Grandmother   . Heart disease Maternal Grandfather     Social History  Social History   Social History  . Marital status: Married    Spouse name: N/A  . Number of children: N/A  . Years of education: N/A   Occupational History  . IT professional    Social History Main Topics  . Smoking status: Never Smoker  . Smokeless tobacco: Never Used  . Alcohol use 1.2 oz/week    1 Cans of beer, 1 Glasses of wine per week     Comment: drink - beer, wine, and hard liquor  . Drug use: No  . Sexual  activity: Not Currently     Comment: number of sex partners in the last 44 months - 23, birth control method  (pill)   Other Topics Concern  . Not on file   Social History Narrative   Exercise indoor roller hockey 1-2 times per week for 2 hours   College   Married   Physicist, medical          Review of Systems, as per HPI, otherwise negative General:  No chills, fever, night sweats or weight changes.  Cardiovascular:  No chest pain, dyspnea on exertion, edema, orthopnea, palpitations, paroxysmal nocturnal dyspnea. Dermatological: No rash, lesions/masses Respiratory: No cough, dyspnea Urologic: No hematuria, dysuria Abdominal:   No nausea, vomiting, diarrhea, bright red blood per rectum, melena, or hematemesis Neurologic:  No visual changes, wkns, changes in mental status. All other systems reviewed and are otherwise negative except as noted above.  Physical Exam  Blood pressure 122/76, pulse 64, height 5\' 10"  (1.778 m), weight 174 lb (78.9 kg).  General: Pleasant, NAD Psych: Normal affect. Neuro: Alert and oriented X 3. Moves all extremities spontaneously. HEENT: Normal  Neck: Supple without bruits or JVD. Lungs:  Resp regular and unlabored, CTA. Heart: RRR no s3, s4, or murmurs. Abdomen: Soft, non-tender, non-distended, BS + x 4.  Extremities: No clubbing, cyanosis or edema. DP/PT/Radials 2+ and equal bilaterally.  Labs:  No results for input(s): CKTOTAL, CKMB, TROPONINI in the last 72 hours. Lab Results  Component Value Date   WBC 4.2 12/15/2016   HGB 15.7 12/15/2016   HCT 46.1 12/15/2016   MCV 87.8 12/15/2016   PLT 211 12/15/2016    No results found for: DDIMER Invalid input(s): POCBNP    Component Value Date/Time   NA 138 12/15/2016 0916   K 4.0 12/15/2016 0916   CL 103 12/15/2016 0916   CO2 26 12/15/2016 0916   GLUCOSE 85 12/15/2016 0916   BUN 17 12/15/2016 0916   CREATININE 0.98 12/15/2016 0916   CALCIUM 9.4 12/15/2016 0916   PROT 6.8 12/15/2016  0916   ALBUMIN 4.3 12/15/2016 0916   AST 35 12/15/2016 0916   ALT 56 (H) 12/15/2016 0916   ALKPHOS 76 12/15/2016 0916   BILITOT 0.9 12/15/2016 0916   GFRNONAA 85 12/15/2016 0916   GFRAA >89 12/15/2016 0916   Lab Results  Component Value Date   CHOL 241 (H) 12/15/2016   HDL 56 12/15/2016   LDLCALC 158 (H) 12/15/2016   TRIG 133 12/15/2016   Accessory Clinical Findings  Echocardiogram - none  ECG - NSR, normal ECG.Unchanged from prior. Personally reviewed.   Assessment & Plan  A very pleasant 61 year old gentleman  1. Preventive cardiology with significant FH of premature CAD, negative treadmill exercise stress for ischemia and evidence of CAD with calcium score 48, intolerant to statins, with  significant neurologic side effects. We will try to enrol him in [facility] 9 inhibitors trial in the near future.   2. Blood pressure - well controlled  3. Hyperlipidemia - significant confusion with 3 different statins, most recent LDL 158, no significant improvement on zetia we discontinued. I have discussed this case with our pharmacist will try to enroll him in one of the [facility] 9 inhibitors trials.  Follow up in 1 year   Ena Dawley, MD, [facility] 02/21/2017, 11:10 AM

## 2017-03-04 DIAGNOSIS — N401 Enlarged prostate with lower urinary tract symptoms: Secondary | ICD-10-CM | POA: Diagnosis not present

## 2017-03-11 DIAGNOSIS — N401 Enlarged prostate with lower urinary tract symptoms: Secondary | ICD-10-CM | POA: Diagnosis not present

## 2017-03-11 DIAGNOSIS — R972 Elevated prostate specific antigen [PSA]: Secondary | ICD-10-CM | POA: Diagnosis not present

## 2017-05-26 ENCOUNTER — Other Ambulatory Visit: Payer: Self-pay | Admitting: Family Medicine

## 2017-09-23 ENCOUNTER — Ambulatory Visit: Payer: 59 | Admitting: Family Medicine

## 2017-09-23 ENCOUNTER — Encounter: Payer: Self-pay | Admitting: Family Medicine

## 2017-09-23 ENCOUNTER — Ambulatory Visit (INDEPENDENT_AMBULATORY_CARE_PROVIDER_SITE_OTHER): Payer: 59 | Admitting: Family Medicine

## 2017-09-23 VITALS — BP 130/88 | HR 64 | Ht 70.0 in | Wt 172.0 lb

## 2017-09-23 DIAGNOSIS — E785 Hyperlipidemia, unspecified: Secondary | ICD-10-CM | POA: Diagnosis not present

## 2017-09-23 DIAGNOSIS — N62 Hypertrophy of breast: Secondary | ICD-10-CM

## 2017-09-23 DIAGNOSIS — R140 Abdominal distension (gaseous): Secondary | ICD-10-CM

## 2017-09-23 DIAGNOSIS — Z23 Encounter for immunization: Secondary | ICD-10-CM | POA: Diagnosis not present

## 2017-09-23 DIAGNOSIS — Z13220 Encounter for screening for lipoid disorders: Secondary | ICD-10-CM | POA: Diagnosis not present

## 2017-09-23 LAB — COMPREHENSIVE METABOLIC PANEL
ALK PHOS: 62 U/L (ref 39–117)
ALT: 27 U/L (ref 0–53)
AST: 20 U/L (ref 0–37)
Albumin: 4.6 g/dL (ref 3.5–5.2)
BILIRUBIN TOTAL: 1.2 mg/dL (ref 0.2–1.2)
BUN: 13 mg/dL (ref 6–23)
CO2: 29 mEq/L (ref 19–32)
Calcium: 9.9 mg/dL (ref 8.4–10.5)
Chloride: 100 mEq/L (ref 96–112)
Creatinine, Ser: 1.04 mg/dL (ref 0.40–1.50)
GFR: 77.75 mL/min (ref >60.00)
GLUCOSE: 90 mg/dL (ref 70–99)
Potassium: 4.2 mEq/L (ref 3.5–5.1)
SODIUM: 137 meq/L (ref 135–145)
TOTAL PROTEIN: 7.2 g/dL (ref 6.0–8.3)

## 2017-09-23 LAB — LIPID PANEL
CHOL/HDL RATIO: 4
Cholesterol: 215 mg/dL — ABNORMAL HIGH (ref 0–200)
HDL: 57 mg/dL (ref >39.00)
LDL CALC: 139 mg/dL — AB (ref 0–99)
NONHDL: 157.56
Triglycerides: 93 mg/dL (ref 0.0–149.0)
VLDL: 18.6 mg/dL (ref 0.0–40.0)

## 2017-09-23 LAB — TSH: TSH: 0.97 u[IU]/mL (ref 0.35–4.50)

## 2017-09-23 LAB — H. PYLORI ANTIBODY, IGG: H PYLORI IGG: NEGATIVE

## 2017-09-23 LAB — LIPASE: LIPASE: 20 U/L (ref 11.0–59.0)

## 2017-09-23 LAB — CBC
HEMATOCRIT: 47.6 % (ref 39.0–52.0)
HEMOGLOBIN: 15.7 g/dL (ref 13.0–17.0)
MCHC: 33 g/dL (ref 30.0–36.0)
MCV: 90.2 fl (ref 78.0–100.0)
Platelets: 221 10*3/uL (ref 150.0–400.0)
RBC: 5.27 Mil/uL (ref 4.22–5.81)
RDW: 12.6 % (ref 11.5–15.5)
WBC: 3.6 10*3/uL — AB (ref 4.0–10.5)

## 2017-09-23 LAB — TESTOSTERONE: Testosterone: 444.68 ng/dL (ref 300.00–890.00)

## 2017-09-23 LAB — LDL CHOLESTEROL, DIRECT: Direct LDL: 140 mg/dL

## 2017-09-23 NOTE — Patient Instructions (Signed)
Your breast swelling is likely due to the finasteride. He should gradually improve over the next several weeks to months. If it does not improve, we may need to consider pursuing cosmetic surgery.  We will check blood work for you're abdominal bloating today. I have concern that she may have an infection called H. pylori. We will also check your pancreas and gallbladder levels.   We will check cholesterol today.  Come back soon for a comprehensive physical.  Take care, Dr. [name]

## 2017-09-23 NOTE — Assessment & Plan Note (Signed)
Check lipid panel today 

## 2017-09-23 NOTE — Assessment & Plan Note (Signed)
Discussed natural course with patient. Discussed that it may take several weeks to months before symptoms improved. Discussed slight possibility that his symptoms may never improve and require cosmetic surgery in the future. Return precautions reviewed. Check testosterone today. Check TSH.

## 2017-09-23 NOTE — Progress Notes (Signed)
Subjective:  Edward Medina is a 61 y.o. male who presents today with a chief complaint of Breast swelling and to establish care.   HPI:  Breast Swelling Symptoms started about 4-6 weeks. Thinks that it is related to finasteride. He was taking finasteride 5 mg daily for his BPH. He contacted his urologist, who told him to stop this medication. He has been on finasteride for approximately 4-6 weeks. He noticed some nipple sensitivity however is no longer having this. No breast tenderness. No discharge.   Abdominal Swelling This also started about 4-6 weeks ago. He feels bloated after eating. Also reports early satiety. He thinks he can only eat about half of what he normally eats before getting full. No noticeable weight loss. No nausea or vomiting. No fevers or chills. Has chronic constipation that is at baseline. No diarrhea. No abdominal pain.  ROS: History of present illness, otherwise a 14 point review of systems was performed and was negative  PMH:  The following were reviewed and entered/updated in epic: Past Medical History:  Diagnosis Date  . High cholesterol    Patient Active Problem List   Diagnosis Date Noted  . Gynecomastia 09/23/2017  . Slightly Overweight (BMI 25.0-29.9) 12/15/2016  . Coronary artery disease due to lipid rich plaque 12/24/2015  . FH: CAD (coronary artery disease) 12/24/2015  . Hyperlipidemia LDL goal <100 01/01/2015  . Osteopenia 11/23/2013  . Benign prostatic hyperplasia 12/15/2012   Past Surgical History:  Procedure Laterality Date  . HERNIA REPAIR  2005  . TONSILLECTOMY  1967  . WISDOM TOOTH EXTRACTION  1976    Family History  Problem Relation Age of Onset  . Heart disease Father   . Hyperlipidemia Father   . Heart attack Father   . Stroke Maternal Grandmother   . Heart disease Maternal Grandfather     Medications- reviewed and updated Current Outpatient Prescriptions  Medication Sig Dispense Refill  . aluminum chloride (DRYSOL)  20 % external solution Apply topically at bedtime.    Marland Kitchen aspirin 81 MG tablet Take 81 mg by mouth daily.    . cholecalciferol (VITAMIN D) 1000 units tablet Take 1,000 Units by mouth daily.    . fish oil-omega-3 fatty acids 1000 MG capsule Take 2 g by mouth 3 (three) times a week.     . Multiple Vitamin (MULTI VITAMIN MENS PO) Take by mouth daily.    . Saw Palmetto, Serenoa repens, (SAW PALMETTO PO) Take 1 tablet by mouth daily.     Marland Kitchen terazosin (HYTRIN) 5 MG capsule Take 5 mg by mouth at bedtime.     No current facility-administered medications for this visit.     Allergies-reviewed and updated Allergies  Allergen Reactions  . Atorvastatin Other (See Comments)    Memory impairment  . Finasteride     Breast swelling   . Pravastatin Other (See Comments)    Memory loss/impairment  . Simvastatin Other (See Comments)    MADE HIM FEEL DISORIENTED  . Amoxicillin Rash    SEVERE    Social History   Social History  . Marital status: Married    Spouse name: N/A  . Number of children: 2  . Years of education: N/A   Occupational History  . IT professional    Social History Main Topics  . Smoking status: Never Smoker  . Smokeless tobacco: Never Used  . Alcohol use 1.2 oz/week    1 Cans of beer, 1 Glasses of wine per week  Comment: drink - beer, wine, and hard liquor  . Drug use: No  . Sexual activity: Not Currently     Comment: number of sex partners in the last 59 months - 75, birth control method  (pill)   Other Topics Concern  . None   Social History Narrative   Exercise indoor roller hockey 1-2 times per week for 2 hours   College   Married   Lead Business Analyst           Objective:  Physical Exam: BP 130/88   Pulse 64   Ht 5\' 10"  (1.778 m)   Wt 172 lb (78 kg)   SpO2 98%   BMI 24.68 kg/m   Gen: NAD, resting comfortably CV: RRR with no murmurs appreciated Pulm: NWOB, CTAB with no crackles, wheezes, or rhonchi Chest: Mild gynecomastia noted bilaterally.  No masses appreciated. No nipple discharge. GI: Normal bowel sounds present. Soft, Nontender, Nondistended. MSK: No edema, cyanosis, or clubbing noted Skin: Warm, dry Neuro: Grossly normal, moves all extremities Psych: Normal affect and thought content  Assessment/Plan:  Gynecomastia Discussed natural course with patient. Discussed that it may take several weeks to months before symptoms improved. Discussed slight possibility that his symptoms may never improve and require cosmetic surgery in the future. Return precautions reviewed. Check testosterone today. Check TSH.  Hyperlipidemia LDL goal <100 Check lipid panel today.  Abdominal bloating Symptoms consistent with H. pylori infection. We'll check antibody level today. Also check lipase and CMET. No red flag signs or symptoms. If workup negative, would consider empiric trial of acid blocker medication such as ranitidine.  Preventative healthcare Flu shot given today. Up-to-date on colonoscopy and tetanus.  [name]. [name], MD 09/23/2017 12:07 PM

## 2017-09-26 ENCOUNTER — Encounter: Payer: Self-pay | Admitting: *Deleted

## 2017-09-27 ENCOUNTER — Encounter: Payer: Self-pay | Admitting: Family Medicine

## 2017-09-27 NOTE — Telephone Encounter (Signed)
Patient's lab results were annotated earlier this week with further recommendations. I am not sure if these recommendations were conveyed to the patient.  Can you call the patient and discuss my recommendations?  Edward Medina. [name], MD 09/27/2017 2:19 PM

## 2017-09-29 ENCOUNTER — Other Ambulatory Visit: Payer: Self-pay

## 2017-12-05 ENCOUNTER — Ambulatory Visit: Payer: Self-pay

## 2017-12-05 ENCOUNTER — Telehealth: Payer: Self-pay | Admitting: Family Medicine

## 2017-12-05 ENCOUNTER — Encounter: Payer: Self-pay | Admitting: Family Medicine

## 2017-12-05 ENCOUNTER — Ambulatory Visit (INDEPENDENT_AMBULATORY_CARE_PROVIDER_SITE_OTHER): Payer: 59 | Admitting: Family Medicine

## 2017-12-05 ENCOUNTER — Ambulatory Visit (INDEPENDENT_AMBULATORY_CARE_PROVIDER_SITE_OTHER): Payer: 59

## 2017-12-05 VITALS — BP 118/74 | HR 72 | Temp 98.1°F | Ht 70.0 in | Wt 174.6 lb

## 2017-12-05 DIAGNOSIS — M47812 Spondylosis without myelopathy or radiculopathy, cervical region: Secondary | ICD-10-CM | POA: Diagnosis not present

## 2017-12-05 DIAGNOSIS — N62 Hypertrophy of breast: Secondary | ICD-10-CM | POA: Diagnosis not present

## 2017-12-05 DIAGNOSIS — M542 Cervicalgia: Secondary | ICD-10-CM | POA: Diagnosis not present

## 2017-12-05 MED ORDER — DICLOFENAC SODIUM 1 % TD GEL: 4.0000 g | Freq: Four times a day (QID) | TRANSDERMAL | 1 refills | Status: DC

## 2017-12-05 MED ORDER — BACLOFEN 20 MG PO TABS: 20.0000 mg | ORAL_TABLET | Freq: Three times a day (TID) | ORAL | 0 refills | Status: DC

## 2017-12-05 NOTE — Progress Notes (Signed)
    Subjective:  Edward Medina is a 61 y.o. male who presents today with a chief complaint of neck Medina.   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  This is significantly worsened within the past week.  States that he was looking straight up and noticed a spasm to the left side of his neck.  This lasted for a few hours.  Since then he has had intermittent Medina to both sides of his neck.  No history of trauma.  Medina is worsened with rotation of his head to either side and with looking straight up.  He will have a cracking sensation whenever he turns his neck to either side.  He has tried taking naproxen which helped a little bit, however he does not take this regularly.  No weakness or numbness.  No tingling or pins and needles type sensations.  Is never seen a physician for this in the past.  Gynecomastia, established problem, stable Underwent mammogram which was essentially normal.  Not currently having any Medina.  Does not wish for further treatment or workup at this time.  ROS: Per HPI  PMH: Smoking history reviewed.  Never smoker.  Objective:  Physical Exam: BP 118/74 (BP Location: Left Arm, Patient Position: Sitting, Cuff Size: Normal)   Pulse 72   Temp 98.1 F (36.7 C) (Oral)   Ht 5\' 10"  (1.778 m)   Wt 174 lb 9.6 oz (79.2 kg)   SpO2 96%   BMI 25.05 kg/m   Gen: NAD, resting comfortably Neck: No deformities.  Full range of motion.  Nontender to palpation.  Tenderness to occipitalis elicited with rotation of head to right and left.  Tenderness to occipitalis also elicited with neck extension.  Spurling negative bilaterally. Upper extremities: Strength 5 out of 5 throughout.  Biceps reflexes 2+ and symmetric bilaterally.  Assessment/Plan:  Neck Medina No red flag signs or symptoms.  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  We will check plain films to evaluate this and rule out other structural abnormalities.  He has a significant  degree of muscular tenderness - we will start baclofen for this.  Will also start Voltaren gel for his Medina as he wishes to avoid oral medications at this point.  Referral to physical therapy sent in.  Return precautions reviewed.  Follow-up in 3-4 weeks with sports medicine if not improving.  Gynecomastia Stable.  Reassured patient.  Does not wish for further management or evaluation at this time.  Edward Medina. Edward Pain, MD 12/05/2017 10:56 AM

## 2017-12-05 NOTE — Patient Instructions (Signed)
Start the baclofen and voltaren.  We will check neck xrays today.  I will also send a PT referral.  Come back to see Dr Paulla Fore if worsening or not improving in the next few weeks.  Take care,  Dr [name]

## 2017-12-05 NOTE — Telephone Encounter (Signed)
Medication already refilled. diclofenac sodium (VOLTAREN) 1 % GEL 100 g 1 12/05/2017    Sig - Route: Apply 4 g topically 4 (four) times daily. - Topical   Sent to pharmacy as: diclofenac sodium (VOLTAREN) 1 % Gel   E-Prescribing Status: Receipt confirmed by pharmacy (12/05/2017 11:14 AM EST)

## 2017-12-05 NOTE — Assessment & Plan Note (Signed)
Stable.  Reassured patient.  Does not wish for further management or evaluation at this time.

## 2017-12-05 NOTE — Telephone Encounter (Signed)
  Reason for Disposition . [1] MODERATE neck pain (e.g., interferes with normal activities AND [2] present > 3 days  Answer Assessment - Initial Assessment Questions 1. ONSET: "When did the pain begin?"      Started last week. 2. LOCATION: "Where does it hurt?"      Cervical spine and radiates up back of head. 3. PATTERN "Does the pain come and go, or has it been constant since it started?"      Hurts when moving head back; Almost always. 4. SEVERITY: "How bad is the pain?"  (Scale 1-10; or mild, moderate, severe)   - MILD (1-3): doesn't interfere with normal activities    - MODERATE (4-7): interferes with normal activities or awakens from sleep    - SEVERE (8-10):  excruciating pain, unable to do any normal activities      8 5. RADIATION: "Does the pain go anywhere else, shoot into your arms?"     Just upward, no arm involvement. 6. CORD SYMPTOMS: "Any weakness or numbness of the arms or legs?"     No 7. CAUSE: "What do you think is causing the neck pain?"      Unsure 8. NECK OVERUSE: "Any recent activities that involved turning or twisting the neck?"     No 9. OTHER SYMPTOMS: "Do you have any other symptoms?" (e.g., headache, fever, chest pain, difficulty breathing, neck swelling)     No 10. PREGNANCY: "Is there any chance you are pregnant?" "When was your last menstrual period?"       No  Protocols used: NECK PAIN OR STIFFNESS-A-AH Pt. Does not remember any kind of injury that would have caused this pain. Appointment made for today.

## 2017-12-05 NOTE — Telephone Encounter (Signed)
Copied from [place] [id number]. Topic: Quick Communication - Rx Refill/Question >> Dec 05, 2017  1:28 PM Arletha Grippe wrote: Has the patient contacted their pharmacy? Yes.     (Agent: If no, request that the patient contact the pharmacy for the refill.)   Preferred Pharmacy (with phone number or street name): diclofenac sodium (VOLTAREN) 1 % GEL needs prior auth. Pt would like to be called when this is complete.  Also pt would [facility] at [address]  Cb number is [phone]    Agent: Please be advised that RX refills may take up to 3 business days. We ask that you follow-up with your pharmacy.

## 2017-12-06 ENCOUNTER — Other Ambulatory Visit: Payer: Self-pay

## 2017-12-06 MED ORDER — DICLOFENAC SODIUM 1 % TD GEL: 4.0000 g | Freq: Four times a day (QID) | TRANSDERMAL | 1 refills | Status: DC

## 2017-12-06 NOTE — Progress Notes (Signed)
Xray confirms mild to moderate degenerative changes in his neck. He should continue with the treatments we discussed during our visit. If no improvement or if worsening, should follow up with Dr Paulla Fore.  Edward Medina. Edward Pain, MD 12/06/2017 8:07 AM

## 2017-12-06 NOTE — Telephone Encounter (Signed)
PA for diclofenac gel has been approved and approval notice has [facility]ed to [facility].  I also sent a new Rx to [facility]. As patient wanted to change to that pharmacy.

## 2017-12-09 ENCOUNTER — Telehealth: Payer: Self-pay | Admitting: Family Medicine

## 2017-12-09 NOTE — Telephone Encounter (Signed)
Called in requesting dosing information for the diclofenac sodium (Voltaren) gel.  The instructions on how to use it is very vague.  I routed a note to Dr. Marigene Ehlers nurse pool making them aware his questions.

## 2017-12-12 NOTE — Telephone Encounter (Signed)
LM for patient to return call.  Patient should apply thin layer of gel to painful area twice daily.  CRM also placed; ok to relay message.

## 2017-12-15 ENCOUNTER — Telehealth: Payer: Self-pay | Admitting: Family Medicine

## 2017-12-15 NOTE — Telephone Encounter (Signed)
Noted  

## 2017-12-15 NOTE — Telephone Encounter (Signed)
See note

## 2017-12-15 NOTE — Telephone Encounter (Signed)
Discussed the use and application of Voltaren gel- patient to use the smaller amount to painful area of neck twice daily. 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

## 2018-01-17 ENCOUNTER — Encounter: Payer: Self-pay | Admitting: Physical Therapy

## 2018-01-17 ENCOUNTER — Ambulatory Visit (INDEPENDENT_AMBULATORY_CARE_PROVIDER_SITE_OTHER): Payer: 59 | Admitting: Physical Therapy

## 2018-01-17 ENCOUNTER — Telehealth: Payer: Self-pay | Admitting: Family Medicine

## 2018-01-17 DIAGNOSIS — M542 Cervicalgia: Secondary | ICD-10-CM | POA: Diagnosis not present

## 2018-01-17 NOTE — Therapy (Addendum)
[facility] [address] [place], [place], [address] Phone: [phone]   Fax:  [phone]  Physical Therapy Evaluation  Patient Details  Name: Edward Medina MRN: [id number] Date of Birth: [birth year] Referring Provider: Dimas Chyle   Encounter Date: 01/17/2018  PT End of Session - 01/17/18 1537    Visit Number  1    Number of Visits  12    Date for PT Re-Evaluation  02/28/18    Authorization Type  [employer]    PT Start Time  6812    PT Stop Time  1520    PT Time Calculation (min)  48 min    Activity Tolerance  Patient tolerated treatment well    Behavior During Therapy  [facility] for tasks assessed/performed       Past Medical History:  Diagnosis Date  . High cholesterol   . Migraines     Past Surgical History:  Procedure Laterality Date  . HERNIA REPAIR  2005  . TONSILLECTOMY  1967  . WISDOM TOOTH EXTRACTION  1976    There were no vitals filed for this visit.   Subjective Assessment - 01/17/18 1431    Subjective  Pt states increased pain in neck. He states previous pain about 7-8 years ago that cleared up. Recently, in December, had increased pain. Most pain with cervical extension. Pt works from home on computer. He denies headaches and numbness/tingling.     Limitations  Sitting;Reading;House hold activities    Currently in Pain?  Yes    Pain Score  4     Pain Location  Neck    Pain Orientation  Left    Pain Descriptors / Indicators  Sharp    Pain Type  Acute pain    Pain Onset  More than a month ago    Pain Frequency  Intermittent    Aggravating Factors   Extension         [facility] PT Assessment - 01/17/18 0001      Assessment   Medical Diagnosis  Cervical pain    Referring Provider  Dimas Chyle    Next MD Visit  none    Prior Therapy  no      Precautions   Precautions  None      Restrictions   Weight Bearing Restrictions  No      Balance Screen   Has the patient fallen in the past 6 months  No      Prior Function   Level of Independence  Independent      Cognition   Overall Cognitive Status  Within Functional Limits for tasks assessed      ROM / Strength   AROM / PROM / Strength  AROM;Strength      AROM   Overall AROM Comments  Cervical ROM: flexion: WNL;  extension: mod deficit;  Rotation: mild deficit;  SB: minimal deficit;  Shoulder: WNL bilaterally       Strength   Overall Strength Comments  UE: 4/5       Palpation   Palpation comment  Minimal pain to palpate L upper trap and sub occipitals;  Negative testing for facet/joint restrictions;  negative spurlings; negative radiculopathy; negative vertebral artery;              Objective measurements completed on examination: See above findings.      [facility] Adult PT Treatment/Exercise - 01/17/18 0001      Exercises   Exercises  Neck;Shoulder  Neck Exercises: Standing   Neck Retraction  20 reps      Neck Exercises: Seated   Shoulder Rolls  20 reps    Other Seated Exercise  Cervical ROM: flex, ext, rot x5 each       Shoulder Exercises: Standing   Row  20 reps             PT Education - 01/17/18 1537    Education provided  Yes    Education Details  HEP    Person(s) Educated  Patient    Methods  Explanation;Verbal cues;Handout    Comprehension  Verbalized understanding       PT Short Term Goals - 01/17/18 1539      PT SHORT TERM GOAL #1   Title  Pt to be independent with initial HEP    Time  2    Period  Weeks    Status  New    Target Date  01/31/18        PT Long Term Goals - 01/17/18 1539      PT LONG TERM GOAL #1   Title  Pt to demo cervical ROM to be WNL and pain free, to improve ability for head turns while driving, and ADLS    Time  6    Period  Weeks    Status  New    Target Date  02/28/18      PT LONG TERM GOAL #2   Title  Pt to demo decreased pain to 0-2/10 with activity    Time  6    Period  Weeks    Status  New    Target Date  02/28/18      PT LONG TERM GOAL #3    Title  Pt to be independent with long term/final HEP for posture and strength.     Time  6    Period  Weeks    Status  New    Target Date  02/28/18             Plan - 01/17/18 1538    Clinical Impression Statement  Pt presents with primary complaint of increased pain in upper cervical region. Pt with mild limitations with PROM and AROM today, but no specific facet restrictions. Pt able to perform light cervical extension without pain today, which has been very painful in the past for him., and is very hesitant for this motion. He has tightness and restrictions in L sided cervical musculatre, but minimal tenderness to palpate. Other testing negative today for compression, radicular pain, or joint restrictions. Pt with pain that is effecting ability for ADLS, IADLS, and work duties. Pt to benefit from improving ROM, and strength for cervical, UE, and postural muscles. Pt to benefit from skilled PT to improve deficits and return to PLOF without pain.      Clinical Presentation  Stable    Clinical Decision Making  Low    Rehab Potential  Good    PT Frequency  2x / week    PT Duration  6 weeks    PT Treatment/Interventions  ADLs/Self Care Home Management;Cryotherapy;Electrical Stimulation;Iontophoresis 59m/ml Dexamethasone;Moist Heat;Traction;Therapeutic exercise;Therapeutic activities;Functional mobility training;Ultrasound;Neuromuscular re-education;Patient/family education;Manual techniques;Taping;Dry needling;Passive range of motion    Consulted and Agree with Plan of Care  Patient       Patient will benefit from skilled therapeutic intervention in order to improve the following deficits and impairments:  Decreased endurance, Hypomobility, Decreased strength, Pain, Decreased mobility, Decreased range of motion, Postural dysfunction, Improper  body mechanics, Impaired flexibility  Visit Diagnosis: Cervicalgia - Plan: PT plan of care cert/re-cert     Problem List Patient Active Problem  List   Diagnosis Date Noted  . Gynecomastia 09/23/2017  . Slightly Overweight (BMI 25.0-29.9) 12/15/2016  . Coronary artery disease due to lipid rich plaque 12/24/2015  . FH: CAD (coronary artery disease) 12/24/2015  . Hyperlipidemia LDL goal <100 01/01/2015  . Osteopenia 11/23/2013  . Benign prostatic hyperplasia 12/15/2012   Lyndee Hensen, PT, DPT 4:14 PM  01/17/18    [facility] [place], [place], [address] Phone: [phone]   Fax:  [phone]  Name: ELIS SAUBER MRN: [id number] Date of Birth: [birth year]    PHYSICAL THERAPY DISCHARGE SUMMARY  Visits from Start of Care:1   Plan: Patient agrees to discharge.  Patient goals were not met. Patient is being discharged due to not returning since the last visit.  ?????      Lyndee Hensen, PT, DPT 12:26 PM  10/17/18

## 2018-01-17 NOTE — Telephone Encounter (Signed)
Error

## 2018-03-17 DIAGNOSIS — R972 Elevated prostate specific antigen [PSA]: Secondary | ICD-10-CM | POA: Diagnosis not present

## 2018-03-17 LAB — PSA: PSA: 6.15

## 2018-03-23 DIAGNOSIS — N401 Enlarged prostate with lower urinary tract symptoms: Secondary | ICD-10-CM | POA: Diagnosis not present

## 2018-03-23 DIAGNOSIS — R972 Elevated prostate specific antigen [PSA]: Secondary | ICD-10-CM | POA: Diagnosis not present

## 2018-04-08 ENCOUNTER — Encounter: Payer: Self-pay | Admitting: Family Medicine

## 2018-04-10 ENCOUNTER — Encounter: Payer: Self-pay | Admitting: Physical Therapy

## 2018-04-11 NOTE — Telephone Encounter (Signed)
Pt has a lab appt set for Thursday @ 8:30am, call pt to advise over any specific, pt would like to be contacted about the appt

## 2018-04-12 ENCOUNTER — Telehealth: Payer: Self-pay

## 2018-04-12 ENCOUNTER — Other Ambulatory Visit: Payer: Self-pay

## 2018-04-12 DIAGNOSIS — Z0184 Encounter for antibody response examination: Secondary | ICD-10-CM

## 2018-04-12 NOTE — Telephone Encounter (Signed)
Pt on lab schedule for tomorrow, 04/13/18. Note section says for MMR immunity. Please place future lab order. Thank you.

## 2018-04-12 NOTE — Telephone Encounter (Signed)
Order has been placed.

## 2018-04-13 ENCOUNTER — Other Ambulatory Visit (INDEPENDENT_AMBULATORY_CARE_PROVIDER_SITE_OTHER): Payer: 59

## 2018-04-13 DIAGNOSIS — Z0184 Encounter for antibody response examination: Secondary | ICD-10-CM | POA: Diagnosis not present

## 2018-04-14 LAB — MEASLES/MUMPS/RUBELLA IMMUNITY
MUMPS IGG: 287 [AU]/ml
RUBEOLA IGG: 132 [AU]/ml
Rubella: <0.9 index — ABNORMAL LOW

## 2018-05-03 ENCOUNTER — Encounter: Payer: Self-pay | Admitting: Family Medicine

## 2018-05-05 ENCOUNTER — Other Ambulatory Visit: Payer: Self-pay

## 2018-05-05 MED ORDER — TYPHOID VACCINE PO CPDR: 1.0000 | DELAYED_RELEASE_CAPSULE | ORAL | 0 refills | Status: DC

## 2018-10-03 DIAGNOSIS — Z23 Encounter for immunization: Secondary | ICD-10-CM | POA: Diagnosis not present

## 2018-10-09 DIAGNOSIS — R3915 Urgency of urination: Secondary | ICD-10-CM | POA: Diagnosis not present

## 2018-10-09 DIAGNOSIS — R351 Nocturia: Secondary | ICD-10-CM | POA: Diagnosis not present

## 2018-10-09 DIAGNOSIS — N410 Acute prostatitis: Secondary | ICD-10-CM | POA: Diagnosis not present

## 2018-10-09 DIAGNOSIS — N401 Enlarged prostate with lower urinary tract symptoms: Secondary | ICD-10-CM | POA: Diagnosis not present

## 2019-04-06 DIAGNOSIS — R972 Elevated prostate specific antigen [PSA]: Secondary | ICD-10-CM | POA: Diagnosis not present

## 2019-04-13 DIAGNOSIS — R972 Elevated prostate specific antigen [PSA]: Secondary | ICD-10-CM | POA: Diagnosis not present

## 2019-04-13 DIAGNOSIS — N401 Enlarged prostate with lower urinary tract symptoms: Secondary | ICD-10-CM | POA: Diagnosis not present

## 2019-04-13 DIAGNOSIS — R3915 Urgency of urination: Secondary | ICD-10-CM | POA: Diagnosis not present

## 2019-09-07 ENCOUNTER — Encounter: Payer: Self-pay | Admitting: Family Medicine

## 2019-09-07 ENCOUNTER — Ambulatory Visit (INDEPENDENT_AMBULATORY_CARE_PROVIDER_SITE_OTHER): Payer: 59

## 2019-09-07 ENCOUNTER — Other Ambulatory Visit: Payer: Self-pay

## 2019-09-07 DIAGNOSIS — Z23 Encounter for immunization: Secondary | ICD-10-CM

## 2019-11-27 ENCOUNTER — Ambulatory Visit (INDEPENDENT_AMBULATORY_CARE_PROVIDER_SITE_OTHER): Payer: 59 | Admitting: Family Medicine

## 2019-11-27 ENCOUNTER — Encounter: Payer: Self-pay | Admitting: Family Medicine

## 2019-11-27 VITALS — BP 133/81 | HR 57 | Temp 97.7°F | Ht 70.0 in | Wt 198.2 lb

## 2019-11-27 DIAGNOSIS — R079 Chest pain, unspecified: Secondary | ICD-10-CM | POA: Diagnosis not present

## 2019-11-27 MED ORDER — PANTOPRAZOLE SODIUM 40 MG PO TBEC: 40.0000 mg | DELAYED_RELEASE_TABLET | Freq: Every day | ORAL | 0 refills | Status: DC

## 2019-11-27 NOTE — Progress Notes (Signed)
   Chief Complaint:  Edward Medina is a 63 y.o. male who presents today with a chief complaint of chest psin.   Assessment/Plan:  Epigastric Pain EKG with no ischemic changes.  History consistent with gastritis/esophagitis.  Very low index of suspicion for cardiopulmonary etiology.  Abdominal exam relatively benign, however pain is reproduced with palpation of epigastric area.  We will try PPI for a couple of weeks.  If not improving will pursue further work-up with CBC, C met, lipase, and H. pylori.  Discussed reasons to return to care and seek emergent care.  He will follow up in a few weeks for CPE.     Subjective:  HPI:  Chest Pain  Started about a week ago. Located in lower chest Feels like a "balloon is being inflated." Worse with eating. No nausea or vomiting. Has some current discomfort. Feels like a tightness. Nothing like this before. Tried taking an anti-inflammatory which help modestly. No reflux or regurgitation. Symptoms are stable. No other obvious or aggravating or allevating factors.   ROS: Per HPI  PMH: He reports that he has never smoked. He has never used smokeless tobacco. He reports current alcohol use of about 2.0 standard drinks of alcohol per week. He reports that he does not use drugs.      Objective:  Physical Exam: BP 133/81   Pulse (!) 57   Temp 97.7 F (36.5 C)   Ht _0  (1.778 m)   Wt 198 lb 3.2 oz (89.9 kg)   SpO2 98%   BMI 28.44 kg/m   Wt Readings from Last 3 Encounters:  11/27/19 198 lb 3.2 oz (89.9 kg)  12/05/17 174 lb 9.6 oz (79.2 kg)  09/23/17 172 lb (78 kg)  Gen: NAD, resting comfortably CV: Regular rate and rhythm with no murmurs appreciated Pulm: Normal work of breathing, clear to auscultation bilaterally with no crackles, wheezes, or rhonchi GI: Normal bowel sounds present. Soft, Nontender, Nondistended.  Tender to palpation along epigastric area. MSK: No edema, cyanosis, or clubbing noted Skin: Warm, dry Neuro: Grossly normal,  moves all extremities Psych: Normal affect and thought content  EKG: Sinus bradycardia.  No ischemic changes.      [name]. [name], MD 11/27/2019 3:06 PM

## 2019-11-27 NOTE — Patient Instructions (Signed)
It was very nice to see you today!  I think you have inflammation in your stomach and esophagus.  Please try the acid blocker for a couple of weeks.  Let me know if it does not improve.  Please contact us in a few weeks for your physical.   Take care, Dr [name]  Please try these tips to maintain a healthy lifestyle:   Eat at least 3 REAL meals and 1-2 snacks per day.  Aim for no more than 5 hours between eating.  If you eat breakfast, please do so within one hour of getting up.    Obtain twice as many fruits/vegetables as protein or carbohydrate foods for both lunch and dinner. (Half of each meal should be fruits/vegetables, one quarter protein, and one quarter starchy carbs)   Cut down on sweet beverages. This includes juice, soda, and sweet tea.    Exercise at least 150 minutes every week.

## 2019-12-06 ENCOUNTER — Other Ambulatory Visit: Payer: Self-pay

## 2019-12-07 ENCOUNTER — Encounter: Payer: Self-pay | Admitting: Family Medicine

## 2019-12-07 ENCOUNTER — Ambulatory Visit (INDEPENDENT_AMBULATORY_CARE_PROVIDER_SITE_OTHER): Payer: 59 | Admitting: Family Medicine

## 2019-12-07 VITALS — BP 124/78 | HR 74 | Temp 98.0°F | Ht 70.0 in | Wt 174.0 lb

## 2019-12-07 DIAGNOSIS — Z0001 Encounter for general adult medical examination with abnormal findings: Secondary | ICD-10-CM | POA: Diagnosis not present

## 2019-12-07 DIAGNOSIS — I2510 Atherosclerotic heart disease of native coronary artery without angina pectoris: Secondary | ICD-10-CM | POA: Diagnosis not present

## 2019-12-07 DIAGNOSIS — R351 Nocturia: Secondary | ICD-10-CM

## 2019-12-07 DIAGNOSIS — E785 Hyperlipidemia, unspecified: Secondary | ICD-10-CM

## 2019-12-07 DIAGNOSIS — I2583 Coronary atherosclerosis due to lipid rich plaque: Secondary | ICD-10-CM

## 2019-12-07 DIAGNOSIS — Z23 Encounter for immunization: Secondary | ICD-10-CM

## 2019-12-07 DIAGNOSIS — N401 Enlarged prostate with lower urinary tract symptoms: Secondary | ICD-10-CM

## 2019-12-07 DIAGNOSIS — E559 Vitamin D deficiency, unspecified: Secondary | ICD-10-CM

## 2019-12-07 LAB — COMPREHENSIVE METABOLIC PANEL
ALT: 28 U/L (ref 0–53)
AST: 22 U/L (ref 0–37)
Albumin: 4.3 g/dL (ref 3.5–5.2)
Alkaline Phosphatase: 63 U/L (ref 39–117)
BUN: 17 mg/dL (ref 6–23)
CO2: 29 mEq/L (ref 19–32)
Calcium: 9.6 mg/dL (ref 8.4–10.5)
Chloride: 101 mEq/L (ref 96–112)
Creatinine, Ser: 1.01 mg/dL (ref 0.40–1.50)
GFR: 75.1 mL/min (ref >60.00)
Glucose, Bld: 85 mg/dL (ref 70–99)
Potassium: 4.1 mEq/L (ref 3.5–5.1)
Sodium: 138 mEq/L (ref 135–145)
Total Bilirubin: 0.9 mg/dL (ref 0.2–1.2)
Total Protein: 6.8 g/dL (ref 6.0–8.3)

## 2019-12-07 LAB — CBC
HCT: 44.5 % (ref 39.0–52.0)
Hemoglobin: 15.1 g/dL (ref 13.0–17.0)
MCHC: 34 g/dL (ref 30.0–36.0)
MCV: 89.1 fl (ref 78.0–100.0)
Platelets: 206 10*3/uL (ref 150.0–400.0)
RBC: 5 Mil/uL (ref 4.22–5.81)
RDW: 12.7 % (ref 11.5–15.5)
WBC: 5.5 10*3/uL (ref 4.0–10.5)

## 2019-12-07 LAB — LIPID PANEL
Cholesterol: 221 mg/dL — ABNORMAL HIGH (ref 0–200)
HDL: 45.6 mg/dL (ref >39.00)
LDL Cholesterol: 151 mg/dL — ABNORMAL HIGH (ref 0–99)
NonHDL: 175.09
Total CHOL/HDL Ratio: 5
Triglycerides: 122 mg/dL (ref 0.0–149.0)
VLDL: 24.4 mg/dL (ref 0.0–40.0)

## 2019-12-07 LAB — TSH: TSH: 1.36 u[IU]/mL (ref 0.35–4.50)

## 2019-12-07 LAB — VITAMIN D 25 HYDROXY (VIT D DEFICIENCY, FRACTURES): VITD: 39.25 ng/mL (ref 30.00–100.00)

## 2019-12-07 NOTE — Progress Notes (Signed)
Please inform patient of the following:  Cholesterol elevated since last check. Recommend starting fenofibrate 160mg  daily - he has not tolerated statins in the past. All of his other blood work is normal. WE can recheck in 6-12 months.  Edward Medina. [name], MD 12/07/2019 12:44 PM

## 2019-12-07 NOTE — Assessment & Plan Note (Signed)
Check lipid panel, CBC, C met, TSH.

## 2019-12-07 NOTE — Patient Instructions (Signed)
It was very nice to see you today!  Keep up the good work!  We will give you your tetanus vaccine and check blood work.  Come back in 1 year or sooner if needed.  Take care, Dr [name]  Please try these tips to maintain a healthy lifestyle:   Eat at least 3 REAL meals and 1-2 snacks per day.  Aim for no more than 5 hours between eating.  If you eat breakfast, please do so within one hour of getting up.    Each meal should contain half fruits/vegetables, one quarter protein, and one quarter carbs (no bigger than a computer mouse)   Cut down on sweet beverages. This includes juice, soda, and sweet tea.     Drink at least 1 glass of water with each meal and aim for at least 8 glasses per day   Exercise at least 150 minutes every week.    Preventive Care 47-21 Years Old, Male Preventive care refers to lifestyle choices and visits with your health care provider that can promote health and wellness. This includes:  A yearly physical exam. This is also called an annual well check.  Regular dental and eye exams.  Immunizations.  Screening for certain conditions.  Healthy lifestyle choices, such as eating a healthy diet, getting regular exercise, not using drugs or products that contain nicotine and tobacco, and limiting alcohol use. What can I expect for my preventive care visit? Physical exam Your health care provider will check:  Height and weight. These may be used to calculate body mass index (BMI), which is a measurement that tells if you are at a healthy weight.  Heart rate and blood pressure.  Your skin for abnormal spots. Counseling Your health care provider may ask you questions about:  Alcohol, tobacco, and drug use.  Emotional well-being.  Home and relationship well-being.  Sexual activity.  Eating habits.  Work and work Statistician. What immunizations do I need?  Influenza (flu) vaccine  This is recommended every year. Tetanus, diphtheria, and  pertussis (Tdap) vaccine  You may need a Td booster every 10 years. Varicella (chickenpox) vaccine  You may need this vaccine if you have not already been vaccinated. Zoster (shingles) vaccine  You may need this after age 83. Measles, mumps, and rubella (MMR) vaccine  You may need at least one dose of MMR if you were born in 1957 or later. You may also need a second dose. Pneumococcal conjugate (PCV13) vaccine  You may need this if you have certain conditions and were not previously vaccinated. Pneumococcal polysaccharide (PPSV23) vaccine  You may need one or two doses if you smoke cigarettes or if you have certain conditions. Meningococcal conjugate (MenACWY) vaccine  You may need this if you have certain conditions. Hepatitis A vaccine  You may need this if you have certain conditions or if you travel or work in places where you may be exposed to hepatitis A. Hepatitis B vaccine  You may need this if you have certain conditions or if you travel or work in places where you may be exposed to hepatitis B. Haemophilus influenzae type b (Hib) vaccine  You may need this if you have certain risk factors. Human papillomavirus (HPV) vaccine  If recommended by your health care provider, you may need three doses over 6 months. You may receive vaccines as individual doses or as more than one vaccine together in one shot (combination vaccines). Talk with your health care provider about the risks and benefits  of combination vaccines. What tests do I need? Blood tests  Lipid and cholesterol levels. These may be checked every 5 years, or more frequently if you are over 69 years old.  Hepatitis C test.  Hepatitis B test. Screening  Lung cancer screening. You may have this screening every year starting at age 33 if you have a 30-pack-year history of smoking and currently smoke or have quit within the past 15 years.  Prostate cancer screening. Recommendations will vary depending on your  family history and other risks.  Colorectal cancer screening. All adults should have this screening starting at age 77 and continuing until age 38. Your health care provider may recommend screening at age 51 if you are at increased risk. You will have tests every 1-10 years, depending on your results and the type of screening test.  Diabetes screening. This is done by checking your blood sugar (glucose) after you have not eaten for a while (fasting). You may have this done every 1-3 years.  Sexually transmitted disease (STD) testing. Follow these instructions at home: Eating and drinking  Eat a diet that includes fresh fruits and vegetables, whole grains, lean protein, and low-fat dairy products.  Take vitamin and mineral supplements as recommended by your health care provider.  Do not drink alcohol if your health care provider tells you not to drink.  If you drink alcohol: ? Limit how much you have to 0-2 drinks a day. ? Be aware of how much alcohol is in your drink. In the [place] one drink equals one 12 oz bottle of beer (355 mL), one 5 oz glass of wine (148 mL), or one 1 oz glass of hard liquor (44 mL). Lifestyle  Take daily care of your teeth and gums.  Stay active. Exercise for at least 30 minutes on 5 or more days each week.  Do not use any products that contain nicotine or tobacco, such as cigarettes, e-cigarettes, and chewing tobacco. If you need help quitting, ask your health care provider.  If you are sexually active, practice safe sex. Use a condom or other form of protection to prevent STIs (sexually transmitted infections).  Talk with your health care provider about taking a low-dose aspirin every day starting at age 33. What's next?  Go to your health care provider once a year for a well check visit.  Ask your health care provider how often you should have your eyes and teeth checked.  Stay up to date on all vaccines. This information is not intended to replace  advice given to you by your health care provider. Make sure you discuss any questions you have with your health care provider. Document Released: 01/02/2016 Document Revised: 11/30/2018 Document Reviewed: 11/30/2018 Elsevier Patient Education  2020 [employer].

## 2019-12-07 NOTE — Assessment & Plan Note (Signed)
Check lipid panel  

## 2019-12-07 NOTE — Progress Notes (Signed)
Chief Complaint:  Edward Medina is a 63 y.o. male who presents today for his annual comprehensive physical exam.    Assessment/Plan:  Coronary artery disease due to lipid rich plaque Check lipid panel.  Hyperlipidemia LDL goal <100 Check lipid panel, CBC, C met, TSH.  Benign prostatic hyperplasia Continue Terazosin per urology.  Preventative Healthcare: Tdap given today.  Check CBC, C met, TSH, and lipid panel.  Gets PSA done through urology.  Due for colonoscopy in 3 years.  Patient Counseling(The following topics were reviewed and/or handout was given):  -Nutrition: Stressed importance of moderation in sodium/caffeine intake, saturated fat and cholesterol, caloric balance, sufficient intake of fresh fruits, vegetables, and fiber.  -Stressed the importance of regular exercise.   -Substance Abuse: Discussed cessation/primary prevention of tobacco, alcohol, or other drug use; driving or other dangerous activities under the influence; availability of treatment for abuse.   -Injury prevention: Discussed safety belts, safety helmets, smoke detector, smoking near bedding or upholstery.   -Sexuality: Discussed sexually transmitted diseases, partner selection, use of condoms, avoidance of unintended pregnancy and contraceptive alternatives.   -Dental health: Discussed importance of regular tooth brushing, flossing, and dental visits.  -Health maintenance and immunizations reviewed. Please refer to Health maintenance section.  Return to care in 1 year for next preventative visit.     Subjective:  HPI:  He has no acute complaints today.   He was seen a week and a half ago for chest pain.  This has resolved with Protonix.  His stable, chronic medical conditions are outlined below:   # BPH - Sees urology - On terazosin 53m daily at bedtime  # CAD / Dyslipidemia - Follows with cardiology  Lifestyle Diet: Balanced. Tried to get plenty of fruits and vegetables.  Exercise: Likes  to play hockey but has been limited due to Covid. Does crossfit at home with resistance training.   Depression screen PHQ 2/9 12/07/2019  Decreased Interest 0  Down, Depressed, Hopeless 0  PHQ - 2 Score 0    There are no preventive care reminders to display for this patient.   ROS: Per HPI, otherwise a complete review of systems was negative.   PMH:  The following were reviewed and entered/updated in epic: Past Medical History:  Diagnosis Date  . FH: CAD (coronary artery disease) 12/24/2015  . High cholesterol   . Migraines    Patient Active Problem List   Diagnosis Date Noted  . Gynecomastia 09/23/2017  . Coronary artery disease due to lipid rich plaque 12/24/2015  . Hyperlipidemia LDL goal <100 01/01/2015  . Osteopenia 11/23/2013  . Benign prostatic hyperplasia 12/15/2012   Past Surgical History:  Procedure Laterality Date  . HERNIA REPAIR  2005  . TONSILLECTOMY  1967  . WISDOM TOOTH EXTRACTION  1976    Family History  Problem Relation Age of Onset  . Heart disease Father   . Hyperlipidemia Father   . Heart attack Father   . Stroke Maternal Grandmother   . Heart disease Maternal Grandfather     Medications- reviewed and updated Current Outpatient Medications  Medication Sig Dispense Refill  . aluminum chloride (DRYSOL) 20 % external solution Apply topically at bedtime.    .Marland Kitchenaspirin 81 MG tablet Take 81 mg by mouth daily.    . fish oil-omega-3 fatty acids 1000 MG capsule Take 2 g by mouth 3 (three) times a week.     . Multiple Vitamin (MULTI VITAMIN MENS PO) Take by mouth daily.    .Marland Kitchen  pantoprazole (PROTONIX) 40 MG tablet Take 1 tablet (40 mg total) by mouth daily. 30 tablet 0  . Saw Palmetto, Serenoa repens, (SAW PALMETTO PO) Take 1 tablet by mouth daily.     Marland Kitchen terazosin (HYTRIN) 5 MG capsule Take 5 mg by mouth at bedtime.     No current facility-administered medications for this visit.    Allergies-reviewed and updated Allergies  Allergen Reactions  .  Atorvastatin Other (See Comments)    Memory impairment  . Finasteride     Breast swelling   . Pravastatin Other (See Comments)    Memory loss/impairment  . Simvastatin Other (See Comments)    MADE HIM FEEL DISORIENTED  . Amoxicillin Rash    SEVERE    Social History   Socioeconomic History  . Marital status: Married    Spouse name: Not on file  . Number of children: 2  . Years of education: Not on file  . Highest education level: Not on file  Occupational History  . Occupation: Risk manager  Tobacco Use  . Smoking status: Never Smoker  . Smokeless tobacco: Never Used  Substance and Sexual Activity  . Alcohol use: Yes    Alcohol/week: 2.0 standard drinks    Types: 1 Glasses of wine, 1 Cans of beer per week    Comment: drink - beer, wine, and hard liquor  . Drug use: No  . Sexual activity: Yes    Partners: Female    Comment: number of sex partners in the last 73 months - 60, birth control method  (pill)  Other Topics Concern  . Not on file  Social History Narrative   Exercise indoor roller hockey 1-2 times per week for 2 hours   College   Married   Physicist, medical         Social Determinants of Health   Financial Resource Strain:   . Difficulty of Paying Living Expenses: Not on file  Food Insecurity:   . Worried About Charity fundraiser in the Last Year: Not on file  . Ran Out of Food in the Last Year: Not on file  Transportation Needs:   . Lack of Transportation (Medical): Not on file  . Lack of Transportation (Non-Medical): Not on file  Physical Activity:   . Days of Exercise per Week: Not on file  . Minutes of Exercise per Session: Not on file  Stress:   . Feeling of Stress : Not on file  Social Connections:   . Frequency of Communication with Friends and Family: Not on file  . Frequency of Social Gatherings with Friends and Family: Not on file  . Attends Religious Services: Not on file  . Active Member of Clubs or Organizations: Not on file    . Attends Archivist Meetings: Not on file  . Marital Status: Not on file        Objective:  Physical Exam: BP 124/78 (BP Location: Left Arm, Patient Position: Sitting, Cuff Size: Normal)   Pulse 74   Temp 98 F (36.7 C) (Temporal)   Ht '5\' 10"'  (1.778 m)   Wt 174 lb (78.9 kg)   SpO2 97%   BMI 24.97 kg/m   Body mass index is 24.97 kg/m. Wt Readings from Last 3 Encounters:  12/07/19 174 lb (78.9 kg)  11/27/19 198 lb 3.2 oz (89.9 kg)  12/05/17 174 lb 9.6 oz (79.2 kg)   Gen: NAD, resting comfortably HEENT: TMs normal bilaterally. OP clear. No thyromegaly noted.  CV: RRR with no murmurs appreciated Pulm: NWOB, CTAB with no crackles, wheezes, or rhonchi GI: Normal bowel sounds present. Soft, Nontender, Nondistended. MSK: no edema, cyanosis, or clubbing noted Skin: warm, dry Neuro: CN2-12 grossly intact. Strength 5/5 in upper and lower extremities. Reflexes symmetric and intact bilaterally.  Psych: Normal affect and thought content     Caedence Snowden M. [name], MD 12/07/2019 8:45 AM

## 2019-12-07 NOTE — Assessment & Plan Note (Signed)
Continue Terazosin per urology.

## 2019-12-10 ENCOUNTER — Telehealth: Payer: Self-pay

## 2019-12-10 ENCOUNTER — Encounter: Payer: Self-pay | Admitting: Family Medicine

## 2019-12-10 NOTE — Telephone Encounter (Signed)
Notified patient.

## 2019-12-10 NOTE — Telephone Encounter (Signed)
Copied from [place] [id number]. Topic: General - Other >> Dec 10, 2019  4:08 PM Alanda Slim E wrote: Reason for CRM: Pt returned Tameka's call/ please advise

## 2019-12-24 ENCOUNTER — Other Ambulatory Visit: Payer: Self-pay | Admitting: Family Medicine

## 2019-12-24 DIAGNOSIS — I2583 Coronary atherosclerosis due to lipid rich plaque: Secondary | ICD-10-CM

## 2019-12-24 DIAGNOSIS — I2510 Atherosclerotic heart disease of native coronary artery without angina pectoris: Secondary | ICD-10-CM

## 2019-12-24 DIAGNOSIS — E785 Hyperlipidemia, unspecified: Secondary | ICD-10-CM

## 2019-12-24 DIAGNOSIS — Z789 Other specified health status: Secondary | ICD-10-CM

## 2019-12-24 NOTE — Telephone Encounter (Signed)
I agree that you can wait until we are able to see you in person, I am adding some clinic days, we can fit you on those if interested. I can probably approve fenofibrate online.

## 2019-12-25 NOTE — Telephone Encounter (Signed)
I actually disagree, fenofibrate will help primarily with triglycerides which in his case are normal, he has significantly elevated LDL, I will refer him to the lipid clinic for evaluation for PCSK9 inhibitors, he also has evidence of coronary calcifications on calcium score in 2015 so he should qualify for it.

## 2019-12-25 NOTE — Telephone Encounter (Signed)
Scheduled the pt to come in and see Dr. Meda Coffee on 01/11/20 at 2:00 pm.  Dr. Meda Coffee, pt is stating in his mychart message that his PCP is recommending that he start taking Fenofibrate 160 mg po daily, due to multiple statin intolerances, per the pt.  He is wanting to know if you agree with his PCP to start taking Fenofibrate 160 mg po daily, before trying this medication and getting it filled. Please advise!

## 2019-12-26 NOTE — Telephone Encounter (Signed)
RE: Refer to lipid clinic per Dr. Meda Coffee for evaluation of PCSK9-Inhibitors Received: Today Message Contents  Conley Simmonds, LPN  Appt made on [id number].

## 2019-12-26 NOTE — Telephone Encounter (Signed)
Edward Spark, MD  You 14 hours ago (9:47 PM)     I actually disagree, fenofibrate will help primarily with triglycerides which in his case are normal, he has significantly elevated LDL, I will refer him to the lipid clinic for evaluation for PCSK9 inhibitors, he also has evidence of coronary calcifications on calcium score in 2015 so he should qualify for it.      Spoke with the pt and informed him of recommendations, as mentioned above, per Dr. Meda Coffee.  Pt states he is more than interested in being referred to our lipid clinic for evaluation of PCSK9-Inbitors, and would ideally like to have this appt arranged prior to seeing Dr. Meda Coffee in clinic on 01/11/20. Informed the pt that I will place the referral in the system, and have our [facility] schedulers call him back and arrange his consult lipid clinic appt.  Pt verbalized understanding and agrees with this plan.

## 2019-12-31 ENCOUNTER — Ambulatory Visit (INDEPENDENT_AMBULATORY_CARE_PROVIDER_SITE_OTHER): Payer: 59 | Admitting: Pharmacist

## 2019-12-31 ENCOUNTER — Other Ambulatory Visit: Payer: Self-pay

## 2019-12-31 DIAGNOSIS — E785 Hyperlipidemia, unspecified: Secondary | ICD-10-CM

## 2019-12-31 DIAGNOSIS — I2583 Coronary atherosclerosis due to lipid rich plaque: Secondary | ICD-10-CM | POA: Diagnosis not present

## 2019-12-31 DIAGNOSIS — I2510 Atherosclerotic heart disease of native coronary artery without angina pectoris: Secondary | ICD-10-CM

## 2019-12-31 NOTE — Patient Instructions (Addendum)
It was nice to see you today  Your LDL is currently 151 and your goal is < 70  You can stop taking red yeast rice  I will submit for coverage for either Repatha or Praluent injections and let you know once they are approved  Your can bring your first injection to your appointment with Dr Meda Coffee  We will plan to schedule follow up labs about 2 months after starting on injections  Call [facility], Pharmacist with any concerns [phone]

## 2019-12-31 NOTE — Progress Notes (Signed)
Patient ID: GAVRIL RADICH                 DOB: 23-Aug-1956                    MRN: [id number]     HPI: Edward Medina is a 64 y.o. male patient referred to lipid clinic by Dr Meda Coffee. PMH is significant for HLD and family history of premature CAD. His father died from a massive heart attack at age 49. Exercise stress test negative for ischemia, calcium score of 48 which was 56th percentile for age and sex matched control with evidence of CAD in the proximal LAD. He is intolerant to 3 statins (memory changes). 10 year ASCVD risk is 10.3% (6.3% is risk with optimal risk factor control). Most recently, PCP recommended pt be started on fenofibrate. Dr Meda Coffee referred to lipid clinic as TG are normal and PCSK9i would be more appropriate to treat elevated LDL.   Pt presents today in good spirits. His 2 children went to [facility]. He is taking OTC fish oil 2g daily - this helps with joint pain. Also taking red yeast rice daily. He previously experienced memory issues on 3 statins. He stays active and eats a diet low in saturated fat. He has done much research on PCSK9i prior to today's visit.  Current Medications: OTC fish oil - 2g daily, red yeast rice Intolerances: atorvastatin 10mg  daily - memory impairment, pravastatin 10mg  daily - memory impairment, simvastatin - felt disoriented - had trouble finding his way to work. Zetia 10mg  daily - ineffective Risk Factors: family history of heart disease, CAD noted on calcium score LDL goal: aggressive goal 70mg /dL  Diet: fruits, veggies, avoids [place] food and fast food, avoids sugary drinks, cooks most meals at home   Exercise: roller hockey once weekly pre COVID, now uses his treadmill every other day at home, works remotely - uses a sit down/stand up desk   Family History: Father with hx of MI, HLD, and heart disease - died from MI at age 79. Maternal grandmother with stroke history, maternal grandfather with heart disease history.  Social  History: No tobacco or drug use, occasional alcohol use.  Labs: 12/07/19: TC 221, TG 122, HDL 45.6, LDL 151 (OTC fish oil 2g daily, red yeast rice)  Past Medical History:  Diagnosis Date  . FH: CAD (coronary artery disease) 12/24/2015  . High cholesterol   . Migraines     Current Outpatient Medications on File Prior to Visit  Medication Sig Dispense Refill  . aluminum chloride (DRYSOL) 20 % external solution Apply topically at bedtime.    Marland Kitchen aspirin 81 MG tablet Take 81 mg by mouth daily.    . fish oil-omega-3 fatty acids 1000 MG capsule Take 2 g by mouth 3 (three) times a week.     . Multiple Vitamin (MULTI VITAMIN MENS PO) Take by mouth daily.    . pantoprazole (PROTONIX) 40 MG tablet TAKE 1 TABLET BY MOUTH EVERY DAY 30 tablet 0  . Saw Palmetto, Serenoa repens, (SAW PALMETTO PO) Take 1 tablet by mouth daily.     Marland Kitchen terazosin (HYTRIN) 5 MG capsule Take 5 mg by mouth at bedtime.     No current facility-administered medications on file prior to visit.    Allergies  Allergen Reactions  . Atorvastatin Other (See Comments)    Memory impairment  . Finasteride     Breast swelling   . Pravastatin Other (See Comments)  Memory loss/impairment  . Simvastatin Other (See Comments)    MADE HIM FEEL DISORIENTED  . Amoxicillin Rash    SEVERE    Assessment/Plan:  1. Hyperlipidemia - Baseline LDL 151 above aggressive goal < 70 given family hx of CAD and elevated calcium score. Pt is intolerant to 3 statins, Zetia was ineffective. Discussed PCSK9i therapy including mechanism of action, expected benefits, side effects, and injection technique. Will submit prior authorization and follow up with pt once approved. He plans to bring his first injection to appt with Dr Meda Coffee in a few weeks. Will plan to schedule f/u labs at that time. He prefers rx be sent to [facility] on [address] - will qualify for copay card.  Rubin Dais E. Shonte Soderlund, PharmD, BCACP, [place] [id number] [place], [place] [address] Phone: [phone]; Fax: [phone] 12/31/2019 2:04 PM   Addendum: Prior auth denied, appeals has been submitted.

## 2020-01-03 ENCOUNTER — Telehealth: Payer: Self-pay | Admitting: Pharmacist

## 2020-01-03 NOTE — Telephone Encounter (Signed)
Received call from a Dr Orlean Patten who is a cardiologist that is an external reviewer for the [place] denial. He stated he will summarize that Repatha is being sought for an FDA approved indication but that the patient's insurance plan only typically covers HeFH and ASCVD indications. Should have a determination from insurance company hopefully soon.

## 2020-01-10 NOTE — Telephone Encounter (Signed)
First level appeals denied, his insurance plan is not covering every FDA approved indication for Repatha. 2nd level appeals has been submitted, pt is aware.

## 2020-01-11 ENCOUNTER — Encounter: Payer: Self-pay | Admitting: Cardiology

## 2020-01-11 ENCOUNTER — Ambulatory Visit (INDEPENDENT_AMBULATORY_CARE_PROVIDER_SITE_OTHER): Payer: 59 | Admitting: Cardiology

## 2020-01-11 ENCOUNTER — Other Ambulatory Visit: Payer: Self-pay

## 2020-01-11 VITALS — BP 122/80 | HR 63 | Ht 69.0 in | Wt 181.4 lb

## 2020-01-11 DIAGNOSIS — Z789 Other specified health status: Secondary | ICD-10-CM | POA: Diagnosis not present

## 2020-01-11 DIAGNOSIS — Z8249 Family history of ischemic heart disease and other diseases of the circulatory system: Secondary | ICD-10-CM | POA: Diagnosis not present

## 2020-01-11 DIAGNOSIS — I2583 Coronary atherosclerosis due to lipid rich plaque: Secondary | ICD-10-CM

## 2020-01-11 DIAGNOSIS — I2510 Atherosclerotic heart disease of native coronary artery without angina pectoris: Secondary | ICD-10-CM | POA: Diagnosis not present

## 2020-01-11 DIAGNOSIS — E785 Hyperlipidemia, unspecified: Secondary | ICD-10-CM

## 2020-01-11 NOTE — Patient Instructions (Addendum)
Medication Instructions:   Your physician recommends that you continue on your current medications as directed. Please refer to the Current Medication list given to you today.  *If you need a refill on your cardiac medications before your next appointment, please call your pharmacy*    Follow-Up: At [facility], you and your health needs are our priority.  As part of our continuing mission to provide you with exceptional heart care, we have created designated Provider Care Teams.  These Care Teams include your primary Cardiologist (physician) and Advanced Practice Providers (APPs -  Physician Assistants and Nurse Practitioners) who all work together to provide you with the care you need, when you need it.  Your next appointment:   12 month(s)  The format for your next appointment:   In Person  Provider:   Katarina Nelson, MD   

## 2020-01-11 NOTE — Telephone Encounter (Signed)
Received call from Dr Leane Call who is reviewing secondary appeals for [place]. Provided him with necessary clinical info that was in appeals letter as well. Will await determination from patient's insurance plan.

## 2020-01-11 NOTE — Progress Notes (Signed)
Cardiology Office Note:    Date:  01/11/2020   ID:  Edward, Medina October 11, 1956, MRN [id number]  PCP:  Vivi Barrack, MD  Cardiologist:  No primary care provider on file.  Electrophysiologist:  None   Referring MD: Vivi Barrack, MD   Reason for visit: 3-year follow-up  History of Present Illness:    Edward Medina is a 64 y.o. male with a hx of hyperlipidemia and family history of premature coronary artery disease. Patient's main concern is that his father died of massive heart attack at age of 93. The patient himself was diagnosed with hyperlipidemia few years ago and for a while taking simvastatin that lead to confusion where he lost his way on her way to work. He tried fish oil and red yeast rice without any side effects.  In 2018 he underwent calcium scoring that was 48 in LAD. Started on statins - 3 different ones that led to confusion - significant like not being able to find his way to work. No claudications, DOE, syncope.   02/21/2017, the patient has been doing great, he just returned from a three-week trip to [place] where his daughter is studying. He developed order static hypotension when walking to the bathroom at night and fell on his side of the head resulting in a laceration requiring 19 stitches. He denies any headache blurry vision any strokelike symptoms, they have never performed head CT. He has been really good with his diet and continues to exercise however his lipids remain elevated. He denies any chest pain or shortness of breath.  01/11/2020 -the patient is coming after 3 years, he has been doing great, he works from home and exercises 5 times a week with intense cardio exercise combined with some weightlifting.  He has no symptoms of chest pain or shortness of breath with that, no palpitations no dizziness or syncope.  No lower extremity edema.  He has been seen in our lipid clinic for evaluation for PC SK 9 inhibitors however has not been approved yet.  He  has been taking red yeast rice and fish oil for now.  Past Medical History:  Diagnosis Date  . FH: CAD (coronary artery disease) 12/24/2015  . High cholesterol   . Migraines     Past Surgical History:  Procedure Laterality Date  . HERNIA REPAIR  2005  . TONSILLECTOMY  1967  . WISDOM TOOTH EXTRACTION  1976    Current Medications: Current Meds  Medication Sig  . aluminum chloride (DRYSOL) 20 % external solution Apply topically at bedtime.  Marland Kitchen aspirin 81 MG tablet Take 81 mg by mouth daily.  . Cholecalciferol (VITAMIN D3) 50 MCG (2000 UT) TABS Take 1 tablet by mouth daily.   . COLLAGEN PO Adds as a daily supplement to coffee  . fish oil-omega-3 fatty acids 1000 MG capsule Take 2 g by mouth daily.   . Multiple Vitamin (MULTI VITAMIN MENS PO) Take by mouth daily.  . Red Yeast Rice 600 MG TABS Take 600 mg by mouth daily.   . Saw Palmetto, Serenoa repens, (SAW PALMETTO PO) Take 1 tablet by mouth daily.   Marland Kitchen terazosin (HYTRIN) 5 MG capsule Take 5 mg by mouth at bedtime.     Allergies:   Atorvastatin, Finasteride, Pravastatin, Simvastatin, and Amoxicillin   Social History   Socioeconomic History  . Marital status: Married    Spouse name: Not on file  . Number of children: 2  . Years of education: Not  on file  . Highest education level: Not on file  Occupational History  . Occupation: Risk manager  Tobacco Use  . Smoking status: Never Smoker  . Smokeless tobacco: Never Used  Substance and Sexual Activity  . Alcohol use: Yes    Alcohol/week: 2.0 standard drinks    Types: 1 Glasses of wine, 1 Cans of beer per week    Comment: drink - beer, wine, and hard liquor  . Drug use: No  . Sexual activity: Yes    Partners: Female    Comment: number of sex partners in the last 93 months - 37, birth control method  (pill)  Other Topics Concern  . Not on file  Social History Narrative   Exercise indoor roller hockey 1-2 times per week for 2 hours   College   Married   Ambulance person         Social Determinants of Health   Financial Resource Strain:   . Difficulty of Paying Living Expenses: Not on file  Food Insecurity:   . Worried About Charity fundraiser in the Last Year: Not on file  . Ran Out of Food in the Last Year: Not on file  Transportation Needs:   . Lack of Transportation (Medical): Not on file  . Lack of Transportation (Non-Medical): Not on file  Physical Activity:   . Days of Exercise per Week: Not on file  . Minutes of Exercise per Session: Not on file  Stress:   . Feeling of Stress : Not on file  Social Connections:   . Frequency of Communication with Friends and Family: Not on file  . Frequency of Social Gatherings with Friends and Family: Not on file  . Attends Religious Services: Not on file  . Active Member of Clubs or Organizations: Not on file  . Attends Archivist Meetings: Not on file  . Marital Status: Not on file     Family History: The patient's family history includes Heart attack in his father; Heart disease in his father and maternal grandfather; Hyperlipidemia in his father; Stroke in his maternal grandmother.  ROS:   Please see the history of present illness.     All other systems reviewed and are negative.  EKGs/Labs/Other Studies Reviewed:    The following studies were reviewed today:  EKG:  EKG is ordered today.  The ekg ordered today demonstrates normal sinus rhythm, normal EKG, unchanged from prior.  Recent Labs: 12/07/2019: ALT 28; BUN 17; Creatinine, Ser 1.01; Hemoglobin 15.1; Platelets 206.0; Potassium 4.1; Sodium 138; TSH 1.36  Recent Lipid Panel    Component Value Date/Time   CHOL 221 (H) 12/07/2019 0857   TRIG 122.0 12/07/2019 0857   HDL 45.60 12/07/2019 0857   CHOLHDL 5 12/07/2019 0857   VLDL 24.4 12/07/2019 0857   LDLCALC 151 (H) 12/07/2019 0857   LDLDIRECT 140.0 09/23/2017 1201    Physical Exam:    VS:  BP 122/80   Pulse 63   Ht 5\' 9"  (1.753 m)   Wt 181 lb 6.4 oz (82.3 kg)    SpO2 97%   BMI 26.79 kg/m     Wt Readings from Last 3 Encounters:  01/11/20 181 lb 6.4 oz (82.3 kg)  12/07/19 174 lb (78.9 kg)  11/27/19 198 lb 3.2 oz (89.9 kg)     GEN: Well nourished, well developed in no acute distress HEENT: Normal NECK: No JVD; No carotid bruits LYMPHATICS: No lymphadenopathy CARDIAC: RRR, no murmurs, rubs, gallops RESPIRATORY:  Clear to auscultation without rales, wheezing or rhonchi  ABDOMEN: Soft, non-tender, non-distended MUSCULOSKELETAL:  No edema; No deformity  SKIN: Warm and dry NEUROLOGIC:  Alert and oriented x 3 PSYCHIATRIC:  Normal affect   ASSESSMENT:    1. Coronary artery disease due to lipid rich plaque   2. Hyperlipidemia LDL goal <100   3. Statin intolerance   4. FH: CAD (coronary artery disease)    PLAN:    In order of problems listed above:  1. CAD -with evidence of coronary calcifications on calcium scoring in 2018, also with significant FH of premature CAD, negative treadmill exercise stress for ischemia.  He eats very healthy Mediterranean style diet and performs strenuous exercise 5 times a week without any symptoms.  He is encouraged to do so.  He still needs to be started on lipid-lowering agent that is alternative to statins as he has very significant neurologic side effects with confusion and delirium in the past.  He is being seen in the lipid clinic. He is EKG today is normal and unchanged from prior.  2. Blood pressure - well controlled  3. Hyperlipidemia - as above.   Medication Adjustments/Labs and Tests Ordered: Current medicines are reviewed at length with the patient today.  Concerns regarding medicines are outlined above.  Orders Placed This Encounter  Procedures  . EKG 12-Lead   No orders of the defined types were placed in this encounter.   Patient Instructions  Medication Instructions:   Your physician recommends that you continue on your current medications as directed. Please refer to the Current  Medication list given to you today.  *If you need a refill on your cardiac medications before your next appointment, please call your pharmacy*     Follow-Up: At [facility], you and your health needs are our priority.  As part of our continuing mission to provide you with exceptional heart care, we have created designated Provider Care Teams.  These Care Teams include your primary Cardiologist (physician) and Advanced Practice Providers (APPs -  Physician Assistants and Nurse Practitioners) who all work together to provide you with the care you need, when you need it.  Your next appointment:   12 months  The format for your next appointment:   In Person  Provider:   Ena Dawley, MD       Signed, Ena Dawley, MD  01/11/2020 3:06 PM    [place]

## 2020-01-14 ENCOUNTER — Telehealth: Payer: Self-pay | Admitting: Pharmacist

## 2020-01-14 DIAGNOSIS — I2510 Atherosclerotic heart disease of native coronary artery without angina pectoris: Secondary | ICD-10-CM

## 2020-01-14 NOTE — Telephone Encounter (Signed)
2nd level appeals for Repatha was denied. External appeals has been submitted.

## 2020-01-16 ENCOUNTER — Telehealth: Payer: 59 | Admitting: Physician Assistant

## 2020-02-01 NOTE — Addendum Note (Signed)
Addended by: Auriel Kist E on: 02/01/2020 04:17 PM   Modules accepted: Orders

## 2020-02-01 NOTE — Telephone Encounter (Signed)
Thank you so much

## 2020-02-01 NOTE — Telephone Encounter (Addendum)
Called insurance to follow up on appeals since we never received any update. They stated external appeals was denied as well on 1/26.  Called pt - he has not received a letter of this final appeals, insurance company did not have contact info either for whoever reviewed this request. He did receive a letter stating that [place] would review the request.   Discussed 2 options with pt - apply for [place] to see if they will cover [place] for free since his insurance will not. Or try for enrollment in [facility] trial (pt would need calcium score > 100 and his was 48 in 2015).  [place] - he would still need to meet their income cutoff for Repatha coverage which he does not.  Pt is willing to come for updated coronary calcium score. If > 100, will pursue Vesalius trial. If < 100, pt willing to try low dose rosuvastatin.

## 2020-02-18 ENCOUNTER — Ambulatory Visit (INDEPENDENT_AMBULATORY_CARE_PROVIDER_SITE_OTHER)
Admission: RE | Admit: 2020-02-18 | Discharge: 2020-02-18 | Disposition: A | Discharge status: Home / Self Care | Payer: Self-pay | Source: Ambulatory Visit | Attending: Cardiology | Admitting: Cardiology

## 2020-02-18 ENCOUNTER — Other Ambulatory Visit: Payer: Self-pay

## 2020-02-18 DIAGNOSIS — I2510 Atherosclerotic heart disease of native coronary artery without angina pectoris: Secondary | ICD-10-CM

## 2020-02-18 DIAGNOSIS — I2583 Coronary atherosclerosis due to lipid rich plaque: Secondary | ICD-10-CM

## 2020-02-19 ENCOUNTER — Telehealth: Payer: Self-pay | Admitting: Pharmacist

## 2020-02-19 NOTE — Telephone Encounter (Signed)
Spoke with pt - he is interested in Human resources officer trial. Will forward to research RNs to reach out to patient to discuss trial details further.

## 2020-02-19 NOTE — Telephone Encounter (Signed)
Calcium score came back at 198 (76th percentile for age and sex matched control). Pt now qualifies for Vesalius study investigating Repatha vs placebo in primary prevention patients as his insurance would not cover Repatha despite 3 appeals attempts.  Left message for pt - will discuss results and confirm he is still interested in pursuing Vesalius clinical trial.

## 2020-02-20 NOTE — Telephone Encounter (Signed)
Thank you :)

## 2020-02-22 ENCOUNTER — Telehealth: Payer: Self-pay | Admitting: *Deleted

## 2020-02-22 NOTE — Telephone Encounter (Signed)
LVM to call [phone] about the Vesalius trial.

## 2020-03-05 ENCOUNTER — Other Ambulatory Visit: Payer: Self-pay

## 2020-03-05 ENCOUNTER — Encounter: Payer: 59 | Admitting: *Deleted

## 2020-03-05 VITALS — BP 116/78 | HR 66 | Ht 68.0 in | Wt 177.6 lb

## 2020-03-05 DIAGNOSIS — Z006 Encounter for examination for normal comparison and control in clinical research program: Secondary | ICD-10-CM

## 2020-03-05 NOTE — Research (Signed)
Patient seen and examined.  Chart reviewed.  Sent by lipid clinic for enrollment in [place].  Patient of Dr. Meda Coffee.  Has had multiple statins with significant side effects, and aggressive attempts by clinic to obtain PCSK 9 without success insurance wise.  Is on red yeast rice.  Now referred.  No prior major CE.  Elevated LDL, and repeated CA score that is between 100-200.  Has reviewed the consent form in detail, and the research team and I have answered questions.   No carotid bruits Lung fields clear.  Reg rhythm.  No obvious murmur. Good pulses distally.   Soft abdomen.   No edema noted.   Patient consent signed, sent to pharmacy for injection script.

## 2020-03-05 NOTE — Research (Signed)
Subject Name: Edward Medina  Subject met inclusion and exclusion criteria.  The informed consent form, study requirements and expectations were reviewed with the subject and questions and concerns were addressed prior to the signing of the consent form.  The subject verbalized understanding of the trial requirements.  The subject agreed to participate in the Vesalius  trial and signed the informed consent at 0928 on 03/05/20  The informed consent was obtained prior to performance of any protocol-specific procedures for the subject.  A copy of the signed informed consent was given to the subject and a copy was placed in the subject's medical record.   [name]

## 2020-03-05 NOTE — Research (Signed)
Eligibility Criteria Worksheet    AMG 145 [id number]              Site No. [id number]                      Subject ID [id number]   Code Inclusion Criteria NOTE:The subject is not eligible for the study if any criterion is checked NO  No Yes  These inclusion criteria are to be assessed during the screening period prior to enrollment into the baseline period:  101 Subject has provided informed consent prior to initiation of any study specific activities/procedures '[]'  '[x]'   102 Adult subjects ? 53 years (men) or ? 39 years (women) to < 67 years of age (either sex) and meeting lipid criteria '[]'  '[x]'   107 Subjects must have an LDL-C ? 90 mg/dL (? 2.3 mmol/L) OR non-HDL-C ? 120 mg/dL  (? 3.1 mmol/L), OR apolipoprotein B ? 80 mg/dL (? 1.56 ?mol/L)        ?  Lipid entry criteria can be measured up to 3 months prior to           screening in  the absence of changes to background therapy         ?  Lipid criteria should be assessed after ? 2 weeks of stable,              optimized lipid-lowering therapy '[]'    '[]'   '[]'  '[x]'    '[x]'   '[x]'   108 Diagnostic evidence of at least 1 of the following (A - D) at screening:  '[]'  Significant coronary artery disease meeting at least 1 of the following criteria:          ?  History of coronary revascularization with multi-vessel coronary                Disease as evidenced by any of the following:                           '[]'  (a)percutaneous coronary intervention (PCI) of 2 or more                     vessels,  including branch arteries                   '[]'  (b)  PCI or coronary artery bypass grafting (CABG) with                          residual ?  50% stenosis in a separate, unrevascularized                           vessel, or               '[]'  (c) multi-vessel CABG 5 years or more prior to screening               '[]'  Significant coronary disease without prior revascularization                     As  evidenced  by either a ? 70% stenosis of at least 1                      coronary artery, ? 50% stenosis of 2 or more coronary  arteries, or ? 50% stenosis of the left main coronary artery              '[x]'  known coronary artery calcium score ? 100 in subjects                     without a coronary  artery revascularization prior to                      randomization '[]'  B. Significant atherosclerotic cerebrovascular disease meeting at               least 1 of  the following criteria:         '[]'   prior transient ischemic attack with ? 50% carotid stenosis      '[]'  internal or external carotid artery stenosis of ? 70% or 2 or more ?             50% stenoses      '[]'  prior internal or external carotid artery revascularization      '[]'  C. Significant peripheral arterial disease meeting at least 1 of the            following criteria:       '[]'  ? 50% stenosis in a limb artery       '[]'  history of abdominal aorta treatment (percutaneous and surgical)             due to atherosclerosis disease      '[]'  ankle brachial index (ABI) < 0.85 '[]'  D. Diabetes mellitus with at least 1 of the following:       '[]'  known microvascular disease, defined by diabetic nephropathy or           Treated retinopathy. Diabetic nephropathy defined as persistent             Microalbuminuria (urinary albumin to creatinine ratio ? 30 mg/g)              and/or persistent Estimated glomerular filtration rate  (eGFR)             < 60 mL/min/1.73 m2 that is not  reversible due to an acute illness      '[]'  chronic daily treatment with an intermediate or long-acting insulin      '[]'  diabetes diagnosis ? 10 years ago  '[]'   '[x]'   109 At least 1 of the following high risk criteria (most recent lab values within   6 months prior to screening, as applicable): '[]'  polyvascular disease, defined as coronary, carotid, or peripheral       artery stenosis ? 50% in a second distinct vascular location in a       patient with coronary, cerebral or peripheral arterial disease        (inclusion  criterion 108 A-C)  '[]'  presence of either diabetes mellitus or metabolic syndrome (see Section 12.9 of the protocol) in a subject with coronary, cerebral, or peripheral artery disease (inclusion criterion 108 A-C) '[]'  at least 1 coronary, carotid, or peripheral artery residual stenosis of ? 50% in a patient with diabetes meeting inclusion criterion 108 D '[x]'  LDL-C ? 130 mg/dL (? 3.36 mmol/L), OR non-HDL-C ? 160 mg/dL       (? 4.14 mmol/L), OR apolipoprotein B ? 120 mg/dL (2.3 ?mol/L) if        available '[]'  lipoprotein (a) > 125 nmol/L (50 mg/dL '[x]'  known familial hypercholesterolemia  '[]'  family history of premature coronary artery disease  defined as an MI or CABG  in the subject's father or brother at age < 77 years or an MI or CABG in the  subject's mother or sister at age < 67 years '[]'  hsCRP ? 3.0 mg/L in the absence of an acute illness '[]'  current tobacco use  '[]'  ? 64 years of age  '[]'  menopause before 62 years of age  '[]'  eGFR 15 to < 45 mL/min/1.73 m2  '[]'  coronary artery calcification score ? 300 in a patient without a        coronary revascularization  prior to randomization      '[]'  '[]'   106 FOR [place] ONLY: Subject affiliated to a social security scheme '[]'  '[]'   Exclusion Criteria NOTE: The subject is not eligible for the study if any criterion is checked YES.  Disease Related   201 MI or stroke prior to randomization.  '[x]'  '[]'   202 CABG < 3 months prior to screening  '[x]'  '[]'   203 Uncontrolled or recurrent ventricular tachycardia in the absence of an implantable-cardioverter defibrillator. '[x]'  '[]'   204 Atrial fibrillation not on anticoagulation therapy (vitamin K antagonist, heparin, low-molecular weight heparin, fondaparinux, or non-vitamin K antagonist oral anticoagulant) '[x]'  '[]'   206 Last measured left-ventricular ejection fraction < 30% or New York Heart Association (NYHA) Functional Class III/IV. '[x]'  '[]'   219 Planned arterial revascularization '[x]'  '[]'   Diagnostic Assessments  207 Fasting  triglycerides ? 500 mg/dL (5.7 mmol/L) at screening '[x]'  '[]'   208 End stage renal disease (ESRD), defined as an eGFR < 15 mL/min/1.73 m2 or receiving dialysis  at screening '[x]'  '[]'   Other Medical Conditions  209 Malignancy (except non-melanoma skin cancers, cervical in situ carcinoma, breast ductal carcinoma in situ, or stage 1 prostate carcinoma) within the last 5 years prior to day 1 '[x]'  '[]'   210 History or evidence of clinically significant disease (eg, malignancy, respiratory, gastrointestinal, renal or psychiatric disease) or unstable disorder that, in the opinion of the investigator(s), Amgen physician or designee would pose a risk to the patient's safety or interfere with the study assessments, procedures, completion, or result in a life expectancy of less than 1 year '[x]'  '[]'      220 Persistent acute liver disease or hepatic dysfunction, defined as Child Pugh score of C  (see Appendix 12.11 of the protocol) '[x]'  '[]'   Prior/Concomitant Therapy  212 Previously received a cholesterol ester transfer protein (CETP) inhibitor (ie, anacetrapib, dalcetrapib, evacetrapib), mipomersen, lomitapide, or has undergone LDL-apheresis in the last  12 months prior to LDL-C screening '[x]'  '[]'   221 Previously received or receiving any other therapy to inhibit PCSK9 in the following timeframe  prior to screening:  ?  bococizumab at any time  ? evolocumab, alirocumab, or any other monoclonal antibody against PCSK9 within 3 months  ? inclisiran within 12 months '[x]'  '[]'   Prior/Concurrent Clinical Study Experience  213 Currently receiving treatment in another investigational device or drug study, or less than 30 days since ending treatment on another investigational device or drug study(ies). '[x]'  '[]'   Other Exclusions  3 Male subjects of childbearing potential unwilling to use 1 acceptable method of effective contraception during treatment and for an additional 15 weeks after the last dose of investigational product. Refer  to Section 12.5 of the protocol for additional contraceptive information. '[x]'  '[]'   216 Subject has known sensitivity to any of the products or components to be administered during dosing '[x]'  '[]'   217 Subject likely to not be available to complete all protocol-required study visits or  procedures, and/or to comply with all required study procedures to the best of the subject and investigator's knowledge '[x]'  '[]'   218 Subject is staff personnel directly involved with the study or is a family member of the investigational study staff '[x]'  '[]'   22 Male subject is pregnant, had a positive pregnancy test at screening (by a serum pregnancy test and/or urine pregnancy test), breastfeeding, or planning to become pregnant or breastfeed during treatment and for an additional 15 weeks after the last dose of investigational product. '[x]'  '[]'     999 Subject met eligibility criteria but did not enroll '[x]'  '[]'   Protocol Amendment 2.0, Protocol Date: 16-Feb-2019; [place] Supplement Version 1.0, Protocol Date: 05-Jul-2018 Worksheet Date: 22-Mar-2019 Document Title: [id number] Eligibility Criteria Worksheet Template v 2.0 Approved 18-Feb-201 b-201

## 2020-03-05 NOTE — Research (Signed)
  [facility]-CV Screening Visit  Patient Name Edward Medina DOB [birth year]  Subject ID#  [id number]  Visit Date/Informed Consent Date 03/05/20  Demographics      Sex male   Ethnicity Not Hispanic or Latino  Age 64 y.o.  Race [x] White [] Black or African American [] Asian [] American [place] and [place] Native [] Native Hawaiian [] Other Pacific Islander  Tobacco use  [x] never [] current [] former Type  [] Cigarettes [] Cigars [] E-cigarettes [] Smokeless  Quantity and unit ____ Frequency  [] Daily [] Every week [] Every month [] Occasional [] Other Duration ____  [] Days [] Weeks [] Months [] Years  Reproductive Status  [] Childbearing potentially (Primary method of birth control ____)      (Date of last menstrual period____) (Breastfeeding?____) [] postmenopausal [] Surgically sterile [] Infertile [] other  Current Statin none  Statin intolerance If the subject is not on a high-intensity statin, please indicate the primary reason. [] Not indicated per local professional society guidelines [x] Demonstrated intolerance [] Subject refusal [] other  If demonstrated intolerance, provide statin(s) used Atorvastatin, Pravastatin, Simvastatin  Select symptom related to statin use [] Myalgia [] Increase Creatine Kinase [] Increase in liver function enzymes [x]  Other: memory impairment    Labs  Collection Date  12/07/2019 [id number]  Patient Fasting   [x] Yes   [] No [] Urine pregnancy test [x] Chemistry [x] Fasting lipid panel  Placebo injection Box #  [id number]  Lot# [id number] Administration time 1020  [x] AM [] PM in clinic Quantity Administered    [] None [] Partial [x] Full Reason for None or Partial Dose ____ Administration Site [] Abdomen [x] Arm [] Thigh Laterality [x] Left  [] Right Administered by [x] Patient [] Caregiver

## 2020-03-14 ENCOUNTER — Other Ambulatory Visit: Payer: Self-pay

## 2020-03-14 ENCOUNTER — Encounter: Payer: 59 | Admitting: *Deleted

## 2020-03-14 DIAGNOSIS — Z006 Encounter for examination for normal comparison and control in clinical research program: Secondary | ICD-10-CM

## 2020-03-14 NOTE — Research (Signed)
[facility]-CV Day 1 Visit-Randomization  Patient Name Edward Medina Subject ID# [id number] Randomization# [id number]  Visit Date 03/14/20   IP Dispensation: Date dispensed 03/14/20 Number dispensed- total: 8 Number dispensed home to patient: 7 Box # [id number] Box # [id number]  IP Administration in Clinic: Box # [id number] Administration time [id number] [x] AM [] PM in clinic Quantity Administered    [] None [] Partial [x] Full Reason for None or Partial Dose ____ Administration Site [] Abdomen [x] Arm [] Thigh Laterality [] Left  [x] Right Administered by [x] Patient [] Caregiver   Patient provided with: Subject welcome letter, subject wallet card, subject informational brochure, instructions for use, reference guide, white IP storage box and tote bag.

## 2020-03-17 NOTE — Research (Signed)
Subject and Caregiver Training Checklist   Subject name: Edward Medina  Subject #: [id number]    Date: 03/14/20  Will caregiver be giving injections?  '[]'  Yes   '[x]'  No    (If yes, complete checklist for caregiver.)   Training Activities  Check applicable box once activity is completed  Prepare for Injection  Review Subject Starter Kit (Transport tote, subject welcome letter, IFU, Quick Reference Guide, subject brochure, Autoinjector(AI) injection video module/DVD, subject ID/study contact card)  '[x]'   Gather two (2) training units, reset tool '[x]'   Gather five (5) alcohol swabs '[x]'   Gather Investigational Product pre-filled autoinjector '[x]'   Gather sharps container '[x]'   Injection Instruction  Explain purpose of training and provide overview '[x]'   Review entire IFU and perform test injection with training unit '[x]'   Subject successfully self-injects with "live" autoinjector '[x]'   After the Injection  Ensure subject is comfortable with home self-injections '[x]'   Instruct what to do for unit issues (i.e., malfunction, lost, destroyed), proper storage and return of all used autoinjectors '[x]'   Confirm subject has all questions answered '[x]'    Educator printed name: Preston Fleeting, RN       Note: This certificate must be filed in the subject chart.  If a caregiver is being certified to give the home injection, the certificate must also be filed in the subject chart.

## 2020-07-04 ENCOUNTER — Other Ambulatory Visit: Payer: Self-pay

## 2020-07-04 ENCOUNTER — Encounter: Payer: 59 | Admitting: *Deleted

## 2020-07-04 DIAGNOSIS — Z006 Encounter for examination for normal comparison and control in clinical research program: Secondary | ICD-10-CM

## 2020-07-04 NOTE — Research (Signed)
[facility] Week 16 Visit  Patient Name Edward Medina Subject ID# [id number]   Visit Date 07/04/20   Treatment compliance: Date returned: 07/04/20 Number returned from box# : Number returned from box# : Number returned-total: 7 Number of Full administrations:7  Number of partial administrations:  Number of non administrations: Number of unknown administrations:  Reason for any partial or non administrations: Number due to non compliance: Number due to device complaint: Number due to other:  IP Dispensation: Date dispensed 07/04/20 Number dispensed- total: 8 Number dispensed home to patient: 7 Box # [id number] Box #  [id number]  IP Administration in Clinic: Box # [id number] Administration time 1031 _ [x] AM [] PM in clinic Quantity Administered    [] None [] Partial [x] Full Reason for None or Partial Dose ____ Administration Site [x] Abdomen [] Arm [] Thigh Laterality [x] Left  [] Right Administered by [x] Patient [] Caregiver

## 2020-08-06 ENCOUNTER — Other Ambulatory Visit: Payer: Self-pay | Admitting: Urology

## 2020-08-06 DIAGNOSIS — R972 Elevated prostate specific antigen [PSA]: Secondary | ICD-10-CM

## 2020-08-30 ENCOUNTER — Other Ambulatory Visit: Payer: 59

## 2020-09-03 ENCOUNTER — Ambulatory Visit
Admission: RE | Admit: 2020-09-03 | Discharge: 2020-09-03 | Disposition: A | Discharge status: Home / Self Care | Payer: 59 | Source: Ambulatory Visit | Attending: Urology | Admitting: Urology

## 2020-09-03 ENCOUNTER — Other Ambulatory Visit: Payer: Self-pay

## 2020-09-03 DIAGNOSIS — R972 Elevated prostate specific antigen [PSA]: Secondary | ICD-10-CM

## 2020-09-03 MED ORDER — GADOBENATE DIMEGLUMINE 529 MG/ML IV SOLN
18.0000 mL | Freq: Once | INTRAVENOUS | Status: AC | PRN
  Administered 2020-09-03: 18 mL via INTRAVENOUS

## 2020-09-25 ENCOUNTER — Ambulatory Visit (INDEPENDENT_AMBULATORY_CARE_PROVIDER_SITE_OTHER): Payer: 59

## 2020-09-25 ENCOUNTER — Other Ambulatory Visit: Payer: Self-pay

## 2020-09-25 ENCOUNTER — Encounter: Payer: Self-pay | Admitting: Family Medicine

## 2020-09-25 DIAGNOSIS — Z23 Encounter for immunization: Secondary | ICD-10-CM

## 2020-10-24 ENCOUNTER — Encounter: Payer: 59 | Admitting: *Deleted

## 2020-10-24 ENCOUNTER — Other Ambulatory Visit: Payer: Self-pay

## 2020-10-24 DIAGNOSIS — Z006 Encounter for examination for normal comparison and control in clinical research program: Secondary | ICD-10-CM

## 2020-10-24 NOTE — Research (Signed)
[facility] Week 32 Visit   Patient Name Edward Medina Subject ID# [id number]  Visit Date 10/24/20  Treatment compliance: Date returned: Number returned from box# _____: Number returned from box# _____: Number returned-total:  Number of Full administrations:  Number of partial administrations: Number of non administrations: Number of unknown administrations:  Reason for any partial or non administrations: Number due to non compliance: Number due to device complaint: Number due to other:  IP Dispensation: Date dispensed 10/24/20 Number dispensed- total: 8 Number dispensed home to patient: 7 Box # [id number] Box # [id number]  IP Administration in Clinic: Box # [id number] Administration time: 5573 [x] AM [] PM in clinic Quantity Administered    [] None [] Partial [x] Full Reason for None or Partial Dose ____ Administration Site [x] Abdomen [] Arm [] Thigh Laterality [] Left  [x] Right Administered by [x] Patient [] Caregiver      Subject came to research clinic for visit week 24 for the [facility] Study. IP was removed from refrigerator at 0850, one pen from box [id number] was administered to patient by self subcutaneous inject at 0920. Subject remained in research clinic for thirty minutes after injection for monitoring for any adverse events. Subject tolerated IP well. Next appointment scheduled for Friday, February 25th, 2022 @ 0830. Subject left research clinic at [phone].

## 2020-10-27 ENCOUNTER — Other Ambulatory Visit: Payer: Self-pay

## 2020-10-27 ENCOUNTER — Encounter: Payer: Self-pay | Admitting: Family Medicine

## 2020-10-27 ENCOUNTER — Ambulatory Visit (INDEPENDENT_AMBULATORY_CARE_PROVIDER_SITE_OTHER): Payer: 59 | Admitting: Family Medicine

## 2020-10-27 VITALS — BP 111/72 | HR 59 | Temp 97.9°F | Ht 68.0 in | Wt 173.6 lb

## 2020-10-27 DIAGNOSIS — Z0001 Encounter for general adult medical examination with abnormal findings: Secondary | ICD-10-CM

## 2020-10-27 DIAGNOSIS — E785 Hyperlipidemia, unspecified: Secondary | ICD-10-CM | POA: Diagnosis not present

## 2020-10-27 DIAGNOSIS — R351 Nocturia: Secondary | ICD-10-CM

## 2020-10-27 DIAGNOSIS — E663 Overweight: Secondary | ICD-10-CM

## 2020-10-27 DIAGNOSIS — K5900 Constipation, unspecified: Secondary | ICD-10-CM

## 2020-10-27 DIAGNOSIS — Z789 Other specified health status: Secondary | ICD-10-CM

## 2020-10-27 DIAGNOSIS — N401 Enlarged prostate with lower urinary tract symptoms: Secondary | ICD-10-CM

## 2020-10-27 DIAGNOSIS — Z6826 Body mass index (BMI) 26.0-26.9, adult: Secondary | ICD-10-CM

## 2020-10-27 NOTE — Assessment & Plan Note (Signed)
Check lipid panel, CBC, CMET, TSH. Continue lifestyle modifications.

## 2020-10-27 NOTE — Patient Instructions (Signed)
It was very nice to see you today!  We will check blood work today.  Please try taking as much MiraLAX as needed to have one soft bowel movement daily.  I will see you back in year for your next physical. Please come back to see me sooner if needed.  Take care, Dr [name]  Please try these tips to maintain a healthy lifestyle:   Eat at least 3 REAL meals and 1-2 snacks per day.  Aim for no more than 5 hours between eating.  If you eat breakfast, please do so within one hour of getting up.    Each meal should contain half fruits/vegetables, one quarter protein, and one quarter carbs (no bigger than a computer mouse)   Cut down on sweet beverages. This includes juice, soda, and sweet tea.     Drink at least 1 glass of water with each meal and aim for at least 8 glasses per day   Exercise at least 150 minutes every week.    Preventive Care 62-19 Years Old, Male Preventive care refers to lifestyle choices and visits with your health care provider that can promote health and wellness. This includes:  A yearly physical exam. This is also called an annual well check.  Regular dental and eye exams.  Immunizations.  Screening for certain conditions.  Healthy lifestyle choices, such as eating a healthy diet, getting regular exercise, not using drugs or products that contain nicotine and tobacco, and limiting alcohol use. What can I expect for my preventive care visit? Physical exam Your health care provider will check:  Height and weight. These may be used to calculate body mass index (BMI), which is a measurement that tells if you are at a healthy weight.  Heart rate and blood pressure.  Your skin for abnormal spots. Counseling Your health care provider may ask you questions about:  Alcohol, tobacco, and drug use.  Emotional well-being.  Home and relationship well-being.  Sexual activity.  Eating habits.  Work and work Statistician. What immunizations do I  need?  Influenza (flu) vaccine  This is recommended every year. Tetanus, diphtheria, and pertussis (Tdap) vaccine  You may need a Td booster every 10 years. Varicella (chickenpox) vaccine  You may need this vaccine if you have not already been vaccinated. Zoster (shingles) vaccine  You may need this after age 1. Measles, mumps, and rubella (MMR) vaccine  You may need at least one dose of MMR if you were born in 1957 or later. You may also need a second dose. Pneumococcal conjugate (PCV13) vaccine  You may need this if you have certain conditions and were not previously vaccinated. Pneumococcal polysaccharide (PPSV23) vaccine  You may need one or two doses if you smoke cigarettes or if you have certain conditions. Meningococcal conjugate (MenACWY) vaccine  You may need this if you have certain conditions. Hepatitis A vaccine  You may need this if you have certain conditions or if you travel or work in places where you may be exposed to hepatitis A. Hepatitis B vaccine  You may need this if you have certain conditions or if you travel or work in places where you may be exposed to hepatitis B. Haemophilus influenzae type b (Hib) vaccine  You may need this if you have certain risk factors. Human papillomavirus (HPV) vaccine  If recommended by your health care provider, you may need three doses over 6 months. You may receive vaccines as individual doses or as more than one vaccine together  in one shot (combination vaccines). Talk with your health care provider about the risks and benefits of combination vaccines. What tests do I need? Blood tests  Lipid and cholesterol levels. These may be checked every 5 years, or more frequently if you are over 58 years old.  Hepatitis C test.  Hepatitis B test. Screening  Lung cancer screening. You may have this screening every year starting at age 62 if you have a 30-pack-year history of smoking and currently smoke or have quit within  the past 15 years.  Prostate cancer screening. Recommendations will vary depending on your family history and other risks.  Colorectal cancer screening. All adults should have this screening starting at age 62 and continuing until age 62. Your health care provider may recommend screening at age 62 if you are at increased risk. You will have tests every 1-10 years, depending on your results and the type of screening test.  Diabetes screening. This is done by checking your blood sugar (glucose) after you have not eaten for a while (fasting). You may have this done every 1-3 years.  Sexually transmitted disease (STD) testing. Follow these instructions at home: Eating and drinking  Eat a diet that includes fresh fruits and vegetables, whole grains, lean protein, and low-fat dairy products.  Take vitamin and mineral supplements as recommended by your health care provider.  Do not drink alcohol if your health care provider tells you not to drink.  If you drink alcohol: ? Limit how much you have to 0-2 drinks a day. ? Be aware of how much alcohol is in your drink. In the [place] one drink equals one 12 oz bottle of beer (355 mL), one 5 oz glass of wine (148 mL), or one 1 oz glass of hard liquor (44 mL). Lifestyle  Take daily care of your teeth and gums.  Stay active. Exercise for at least 30 minutes on 5 or more days each week.  Do not use any products that contain nicotine or tobacco, such as cigarettes, e-cigarettes, and chewing tobacco. If you need help quitting, ask your health care provider.  If you are sexually active, practice safe sex. Use a condom or other form of protection to prevent STIs (sexually transmitted infections).  Talk with your health care provider about taking a low-dose aspirin every day starting at age 62. What's next?  Go to your health care provider once a year for a well check visit.  Ask your health care provider how often you should have your eyes and teeth  checked.  Stay up to date on all vaccines. This information is not intended to replace advice given to you by your health care provider. Make sure you discuss any questions you have with your health care provider. Document Revised: 11/30/2018 Document Reviewed: 11/30/2018 Elsevier Patient Education  2020 [employer].

## 2020-10-27 NOTE — Progress Notes (Signed)
Chief Complaint:  Edward Medina is a 64 y.o. male who presents today for his annual comprehensive physical exam.    Assessment/Plan:  Chronic Problems Addressed Today: Benign prostatic hyperplasia Follows with urology. On flomax 0.4mg  twice daily. Symptoms are not well controlled. Will follow up with urology later today.  Hyperlipidemia LDL goal <100 Check lipid panel, CBC, CMET, TSH. Continue lifestyle modifications.  Constipation Recommended MiraLAX as needed to have one soft bowel movement daily. He will occasionally use glycerin suppository.  Statin intolerance Looking into starting Repatha via cardiology.   Body mass index is 26.4 kg/m. / Overweight  BMI Metric Follow Up - 10/27/20 1009      BMI Metric Follow Up-Please document annually   BMI Metric Follow Up Education provided            Preventative Healthcare: Up-to-date on colonoscopy. Recently had PSA and prostate biopsy done via urology. Has received Covid and flu vaccine. Check CBC, CMET, TSH today. Also check lipid panel.  Patient Counseling(The following topics were reviewed and/or handout was given):  -Nutrition: Stressed importance of moderation in sodium/caffeine intake, saturated fat and cholesterol, caloric balance, sufficient intake of fresh fruits, vegetables, and fiber.  -Stressed the importance of regular exercise.   -Substance Abuse: Discussed cessation/primary prevention of tobacco, alcohol, or other drug use; driving or other dangerous activities under the influence; availability of treatment for abuse.   -Injury prevention: Discussed safety belts, safety helmets, smoke detector, smoking near bedding or upholstery.   -Sexuality: Discussed sexually transmitted diseases, partner selection, use of condoms, avoidance of unintended pregnancy and contraceptive alternatives.   -Dental health: Discussed importance of regular tooth brushing, flossing, and dental visits.  -Health maintenance and  immunizations reviewed. Please refer to Health maintenance section.  Return to care in 1 year for next preventative visit.     Subjective:  HPI:  He has no acute complaints today. See A/p for status of chronic conditions.   Lifestyle Diet: Balanced. Plenty of fruits and vegetables.  Exercise: Working on cardio and weights.   Depression screen PHQ 2/9 12/07/2019  Decreased Interest 0  Down, Depressed, Hopeless 0  PHQ - 2 Score 0   There are no preventive care reminders to display for this patient.   ROS: Per HPI, otherwise a complete review of systems was negative.   PMH:  The following were reviewed and entered/updated in epic: Past Medical History:  Diagnosis Date  . FH: CAD (coronary artery disease) 12/24/2015  . High cholesterol   . Migraines    Patient Active Problem List   Diagnosis Date Noted  . Statin intolerance 10/27/2020  . Constipation 10/27/2020  . Gynecomastia 09/23/2017  . Coronary artery disease due to lipid rich plaque 12/24/2015  . Hyperlipidemia LDL goal <100 01/01/2015  . Osteopenia 11/23/2013  . Benign prostatic hyperplasia 12/15/2012   Past Surgical History:  Procedure Laterality Date  . HERNIA REPAIR  2005  . TONSILLECTOMY  1967  . WISDOM TOOTH EXTRACTION  1976    Family History  Problem Relation Age of Onset  . Heart disease Father   . Hyperlipidemia Father   . Heart attack Father   . Stroke Maternal Grandmother   . Heart disease Maternal Grandfather     Medications- reviewed and updated Current Outpatient Medications  Medication Sig Dispense Refill  . aluminum chloride (DRYSOL) 20 % external solution Apply topically at bedtime.    Marland Kitchen aspirin 81 MG tablet Take 81 mg by mouth daily.    Marland Kitchen  Cholecalciferol (VITAMIN D3) 50 MCG (2000 UT) TABS Take 1 tablet by mouth daily.     . COLLAGEN PO Adds as a daily supplement to coffee    . fish oil-omega-3 fatty acids 1000 MG capsule Take 2 g by mouth daily.     . Multiple Vitamin (MULTI VITAMIN  MENS PO) Take by mouth daily.    . Saw Palmetto, Serenoa repens, (SAW PALMETTO PO) Take 1 tablet by mouth daily.     . tamsulosin (FLOMAX) 0.4 MG CAPS capsule      No current facility-administered medications for this visit.    Allergies-reviewed and updated Allergies  Allergen Reactions  . Atorvastatin Other (See Comments)    Memory impairment  . Finasteride     Breast swelling   . Pravastatin Other (See Comments)    Memory loss/impairment  . Simvastatin Other (See Comments)    MADE HIM FEEL DISORIENTED  . Amoxicillin Rash    SEVERE    Social History   Socioeconomic History  . Marital status: Married    Spouse name: Not on file  . Number of children: 2  . Years of education: Not on file  . Highest education level: Not on file  Occupational History  . Occupation: Risk manager  Tobacco Use  . Smoking status: Never Smoker  . Smokeless tobacco: Never Used  Vaping Use  . Vaping Use: Never used  Substance and Sexual Activity  . Alcohol use: Yes    Alcohol/week: 2.0 standard drinks    Types: 1 Glasses of wine, 1 Cans of beer per week    Comment: drink - beer, wine, and hard liquor  . Drug use: No  . Sexual activity: Yes    Partners: Female    Comment: number of sex partners in the last 34 months - 55, birth control method  (pill)  Other Topics Concern  . Not on file  Social History Narrative   Exercise indoor roller hockey 1-2 times per week for 2 hours   [employer]   Married   Physicist, medical         Social Determinants of Health   Financial Resource Strain:   . Difficulty of Paying Living Expenses: Not on file  Food Insecurity:   . Worried About Charity fundraiser in the Last Year: Not on file  . Ran Out of Food in the Last Year: Not on file  Transportation Needs:   . Lack of Transportation (Medical): Not on file  . Lack of Transportation (Non-Medical): Not on file  Physical Activity:   . Days of Exercise per Week: Not on file  . Minutes of  Exercise per Session: Not on file  Stress:   . Feeling of Stress : Not on file  Social Connections:   . Frequency of Communication with Friends and Family: Not on file  . Frequency of Social Gatherings with Friends and Family: Not on file  . Attends Religious Services: Not on file  . Active Member of Clubs or Organizations: Not on file  . Attends Archivist Meetings: Not on file  . Marital Status: Not on file        Objective:  Physical Exam: BP 111/72   Pulse (!) 59   Temp 97.9 F (36.6 C) (Temporal)   Ht 5\' 8"  (1.727 m)   Wt 173 lb 9.6 oz (78.7 kg)   SpO2 100%   BMI 26.40 kg/m   Body mass index is 26.4 kg/m. Wt Readings from  Last 3 Encounters:  10/27/20 173 lb 9.6 oz (78.7 kg)  03/05/20 177 lb 9.6 oz (80.6 kg)  01/11/20 181 lb 6.4 oz (82.3 kg)   Gen: NAD, resting comfortably HEENT: TMs normal bilaterally. OP clear. No thyromegaly noted.  CV: RRR with no murmurs appreciated Pulm: NWOB, CTAB with no crackles, wheezes, or rhonchi GI: Normal bowel sounds present. Soft, Nontender, Nondistended. MSK: no edema, cyanosis, or clubbing noted Skin: warm, dry Neuro: CN2-12 grossly intact. Strength 5/5 in upper and lower extremities. Reflexes symmetric and intact bilaterally.  Psych: Normal affect and thought content     Edward Medina M. [name], MD 10/27/2020 10:10 AM

## 2020-10-27 NOTE — Assessment & Plan Note (Signed)
Looking into starting Repatha via cardiology.

## 2020-10-27 NOTE — Assessment & Plan Note (Addendum)
Follows with urology. On flomax 0.4mg  twice daily. Symptoms are not well controlled. Will follow up with urology later today.

## 2020-10-27 NOTE — Assessment & Plan Note (Signed)
Recommended MiraLAX as needed to have one soft bowel movement daily. He will occasionally use glycerin suppository.

## 2020-10-28 LAB — CBC
HCT: 46.9 % (ref 38.5–50.0)
Hemoglobin: 15.9 g/dL (ref 13.2–17.1)
MCH: 30.3 pg (ref 27.0–33.0)
MCHC: 33.9 g/dL (ref 32.0–36.0)
MCV: 89.5 fL (ref 80.0–100.0)
MPV: 10.6 fL (ref 7.5–12.5)
Platelets: 247 10*3/uL (ref 140–400)
RBC: 5.24 10*6/uL (ref 4.20–5.80)
RDW: 12.1 % (ref 11.0–15.0)
WBC: 4.3 10*3/uL (ref 3.8–10.8)

## 2020-10-28 LAB — COMPREHENSIVE METABOLIC PANEL
AG Ratio: 1.7 (calc) (ref 1.0–2.5)
ALT: 39 U/L (ref 9–46)
AST: 29 U/L (ref 10–35)
Albumin: 4.4 g/dL (ref 3.6–5.1)
Alkaline phosphatase (APISO): 70 U/L (ref 35–144)
BUN: 15 mg/dL (ref 7–25)
CO2: 29 mmol/L (ref 20–32)
Calcium: 10.2 mg/dL (ref 8.6–10.3)
Chloride: 102 mmol/L (ref 98–110)
Creat: 1.06 mg/dL (ref 0.70–1.25)
Globulin: 2.6 g/dL (calc) (ref 1.9–3.7)
Glucose, Bld: 101 mg/dL — ABNORMAL HIGH (ref 65–99)
Potassium: 4.6 mmol/L (ref 3.5–5.3)
Sodium: 139 mmol/L (ref 135–146)
Total Bilirubin: 0.8 mg/dL (ref 0.2–1.2)
Total Protein: 7 g/dL (ref 6.1–8.1)

## 2020-10-28 LAB — LIPID PANEL
Cholesterol: 150 mg/dL (ref <200)
HDL: 58 mg/dL (ref >=40)
LDL Cholesterol (Calc): 73 mg/dL (calc)
Non-HDL Cholesterol (Calc): 92 mg/dL (calc) (ref <130)
Total CHOL/HDL Ratio: 2.6 (calc) (ref <5.0)
Triglycerides: 102 mg/dL (ref <150)

## 2020-10-28 LAB — TSH: TSH: 0.62 mIU/L (ref 0.40–4.50)

## 2020-10-28 NOTE — Progress Notes (Signed)
Please inform patient of the following:  Labs are all stable. Cholesterol is better. Would like for him to keep up the good work and we can recheck in a year.  Edward Medina. [name], MD 10/28/2020 3:32 PM

## 2020-10-29 ENCOUNTER — Telehealth: Payer: Self-pay | Admitting: *Deleted

## 2020-10-29 NOTE — Telephone Encounter (Signed)
Patient stated taking Aspirin  81 mg for many years. Due to present studies will like to know if is ok to continue, or he needed to stop taking Aspirin 81mg ?

## 2020-10-30 NOTE — Telephone Encounter (Signed)
Called and lm for pt tcb. 

## 2020-10-30 NOTE — Telephone Encounter (Signed)
He has known plaque build up in his heart. Probably should stay on it but recommend he talk with his cardiologist.   [name]. [name], MD 10/30/2020 1:00 PM

## 2020-11-20 ENCOUNTER — Encounter: Payer: Self-pay | Admitting: Family Medicine

## 2020-12-09 ENCOUNTER — Encounter: Payer: 59 | Admitting: Family Medicine

## 2021-02-13 ENCOUNTER — Other Ambulatory Visit: Payer: Self-pay

## 2021-02-13 DIAGNOSIS — Z006 Encounter for examination for normal comparison and control in clinical research program: Secondary | ICD-10-CM

## 2021-02-13 NOTE — Research (Signed)
[facility] Week 48 Visit   Patient Name Edward Medina Subject ID#  [id number]  Visit Date 02/13/21 Non-Fatal Potential Endpoint Assessment Yes  No   Has the subject experienced/undergone any of the following since the last visit/contact?  []  [x]   Any Coronary Artery Revascularization/Cerebrovascular Revascularization/ Peripheral Artery Revascularization/Amputation Procedure  []  [x]   Myocardial Infarction []  [x]   Stroke  []  X  Provide the date for the non-fatal Potential Endpoints status:  []  [x]     Treatment compliance: Date returned: 02/13/21 Number returned from box# [id number] : Number returned from box#[id number]: Number returned-total:  7 Number of Full administrations: 7  Number of partial administrations: 0 Number of non administrations:0 Number of unknown administrations:0  Reason for any partial or non administrations: Number due to non compliance: Number due to device complaint: Number due to other:  IP Dispensation: Date dispensed 02/13/21 Number dispensed- total: 8 Number dispensed home to patient: 7 Box # [id number] Box # [id number]   IP Administration in Clinic: Box #  [id number] Administration time 4008 [x] AM [] PM in clinic Quantity Administered    [] None [] Partial [x] Full Reason for None or Partial Dose ____ Administration Site [] Abdomen [x] Arm [] Thigh Laterality [x] Left  [] Right Administered by [x] Patient [] Caregiver

## 2021-02-13 NOTE — Research (Addendum)
error 

## 2021-06-05 ENCOUNTER — Other Ambulatory Visit: Payer: Self-pay

## 2021-06-05 ENCOUNTER — Encounter: Payer: 59 | Admitting: *Deleted

## 2021-06-05 DIAGNOSIS — Z006 Encounter for examination for normal comparison and control in clinical research program: Secondary | ICD-10-CM

## 2021-06-05 NOTE — Research (Signed)
[facility] Week 64 Visit   Patient Name Edward Medina Subject ID# [id number]  Visit Date 06/05/21  Non-Fatal Potential Endpoint Assessment Yes  No   Has the subject experienced/undergone any of the following since the last visit/contact?  []  [x]   Any Coronary Artery Revascularization/Cerebrovascular Revascularization/ Peripheral Artery Revascularization/Amputation Procedure  []  [x]   Myocardial Infarction []  [x]   Stroke  []  [x]   Provide the date for the non-fatal Potential Endpoints status: 06/05/2021       Treatment compliance: Date returned: 06/05/21 Number returned from box# [id number] : 3 Number returned from box# [id number]: 4 Number returned-total: 7 Number of Full administrations:7 Number of partial administrations:0 Number of non administrations:0 Number of unknown administrations:0 Reason for any partial or non administrations: N/A Number due to non compliance: N/A Number due to device complaint:N/A Number due to other: N/A  IP Dispensation: Date dispensed 06/05/21 Number dispensed- total: 8 Number dispensed home to patient: 7 Box #: [id number] Box #: [id number]  IP Administration in Clinic: Box #: [id number] Administration time: 5929 [x] AM [] PM in clinic Quantity Administered:   [] None [] Partial [x] Full Reason for None or Partial Dose ____ Administration Site [x] Abdomen [] Arm [] Thigh Laterality [x] Left  [] Right Administered by [x] Patient [] Caregiver     Patient came into the research clinic today for [facility] visit week 64. All concomitant medications were reviewed and updated if applicable. There are no new AE's or SAE's to report to sponsor at this time. Next appointment scheduled for Friday, September 30th, 2022 @ 0830 am.

## 2021-09-15 ENCOUNTER — Ambulatory Visit (INDEPENDENT_AMBULATORY_CARE_PROVIDER_SITE_OTHER): Payer: 59 | Admitting: Physician Assistant

## 2021-09-15 ENCOUNTER — Encounter: Payer: Self-pay | Admitting: Physician Assistant

## 2021-09-15 ENCOUNTER — Other Ambulatory Visit: Payer: Self-pay

## 2021-09-15 VITALS — BP 102/60 | HR 90 | Temp 97.7°F | Wt 174.8 lb

## 2021-09-15 DIAGNOSIS — R21 Rash and other nonspecific skin eruption: Secondary | ICD-10-CM | POA: Diagnosis not present

## 2021-09-15 MED ORDER — PREDNISONE 10 MG PO TABS: ORAL_TABLET | ORAL | 0 refills | Status: DC

## 2021-09-15 NOTE — Patient Instructions (Signed)
It was great to see you!  Prednisone 20 mg daily x 1 week then prednisone 10 mg (1/2 tablet) daily x 1 week Start daily allergy pill of choice (such as claritin, allegra, zyrtec) x 2 weeks Start daily pepcid (famotidine) x 2 weeks  As needed benadryl for sleep if needed  Keep [place] posted  Take care,  Inda Coke PA-C

## 2021-09-15 NOTE — Progress Notes (Signed)
Edward Medina is a 65 y.o. male here for possibility of shingles.   History of Present Illness:   Chief Complaint  Patient presents with  . Rash    Appeared since last night, all over body. Itching, denies burning sensation  Did receive newest COVID booster last week    HPI  Rash: Edward Medina reports developing a itchy rash on multiple area on his body that started yesterday. His rash started on his lower back and spread to his forearms, calves and along his belt line. He completed yard work on Sunday and notes wearing shorts and gardening gloves. Denies going into any wooded areas.  He has not noticed any changes in his detergents. He also denies having any fever, chills, lightheadedness, dizziness, nausea, vomiting, pain, or past history of eczema. Has used any OTC topical creams or alcohol. Recently had the COVID-19 booster vaccination.    Past Medical History:  Diagnosis Date  . FH: CAD (coronary artery disease) 12/24/2015  . High cholesterol   . Migraines      Social History   Tobacco Use  . Smoking status: Never  . Smokeless tobacco: Never  Vaping Use  . Vaping Use: Never used  Substance Use Topics  . Alcohol use: Yes    Alcohol/week: 2.0 standard drinks    Types: 1 Glasses of wine, 1 Cans of beer per week    Comment: drink - beer, wine, and hard liquor  . Drug use: No    Past Surgical History:  Procedure Laterality Date  . HERNIA REPAIR  2005  . TONSILLECTOMY  1967  . WISDOM TOOTH EXTRACTION  1976    Family History  Problem Relation Age of Onset  . Heart disease Father   . Hyperlipidemia Father   . Heart attack Father   . Stroke Maternal Grandmother   . Heart disease Maternal Grandfather     Allergies  Allergen Reactions  . Atorvastatin Other (See Comments)    Memory impairment  . Finasteride     Breast swelling   . Pravastatin Other (See Comments)    Memory loss/impairment  . Simvastatin Other (See Comments)    MADE HIM FEEL DISORIENTED  .  Amoxicillin Rash    SEVERE    Current Medications:   Current Outpatient Medications:  .  aluminum chloride (DRYSOL) 20 % external solution, Apply topically at bedtime., Disp: , Rfl:  .  aspirin 81 MG tablet, Take 81 mg by mouth daily., Disp: , Rfl:  .  Cholecalciferol (VITAMIN D3) 50 MCG (2000 UT) TABS, Take 1 tablet by mouth daily. , Disp: , Rfl:  .  COLLAGEN PO, Adds as a daily supplement to coffee, Disp: , Rfl:  .  fish oil-omega-3 fatty acids 1000 MG capsule, Take 2 g by mouth daily. , Disp: , Rfl:  .  Multiple Vitamin (MULTI VITAMIN MENS PO), Take by mouth daily., Disp: , Rfl:  .  predniSONE (DELTASONE) 10 MG tablet, Take two tablets daily x 1 week then one tablet daily x 1 week, Disp: 21 tablet, Rfl: 0 .  Saw Palmetto, Serenoa repens, (SAW PALMETTO PO), Take 1 tablet by mouth daily. , Disp: , Rfl:  .  tamsulosin (FLOMAX) 0.4 MG CAPS capsule, , Disp: , Rfl:    Review of Systems:   ROS Negative unless otherwise specified per HPI.  Vitals:   Vitals:   09/15/21 1033  BP: 102/60  Pulse: 90  Temp: 97.7 F (36.5 C)  TempSrc: Temporal  SpO2: 97%  Weight: 174 lb 12.8 oz (79.3 kg)     Body mass index is 26.58 kg/m.  Physical Exam:   Physical Exam Vitals and nursing note reviewed.  Constitutional:      Appearance: He is well-developed.  HENT:     Head: Normocephalic.  Eyes:     Conjunctiva/sclera: Conjunctivae normal.     Pupils: Pupils are equal, round, and reactive to light.  Pulmonary:     Effort: Pulmonary effort is normal.  Musculoskeletal:        General: Normal range of motion.     Cervical back: Normal range of motion.  Skin:    General: Skin is warm and dry.     Comments: Multiple erythematous scattered papular lesions on bilateral wrist, waist, and posterior upper right leg  Neurological:     Mental Status: He is alert and oriented to person, place, and time.  Psychiatric:        Behavior: Behavior normal.        Thought Content: Thought content  normal.        Judgment: Judgment normal.    Assessment and Plan:   Rash and nonspecific skin eruption No evidence of infection on my exam Very low suspicion for shingles given bilateral presentation Will treat for possible contact dermatitis Recommended as follows: -Prednisone 20 mg daily x 1 week then prednisone 10 mg daily x 1 week -Start daily allergy pill of choice (such as claritin, allegra, zyrtec) x 2 weeks -Start daily pepcid (famotidine) x 2 weeks -As needed benadryl for sleep if needed If any worsening or new symptoms, patient was advised to let us know  I,Havlyn C Ratchford,acting as a scribe for [employer], PA.,have documented all relevant documentation on the behalf of Inda Coke, PA,as directed by  Inda Coke, PA while in the presence of Inda Coke, [place].   I, Inda Coke, [place], have reviewed all documentation for this visit. The documentation on 09/15/21 for the exam, diagnosis, procedures, and orders are all accurate and complete.   Inda Coke, PA-C

## 2021-09-16 ENCOUNTER — Encounter: Payer: Self-pay | Admitting: Physician Assistant

## 2021-09-18 ENCOUNTER — Other Ambulatory Visit: Payer: Self-pay

## 2021-09-18 DIAGNOSIS — Z006 Encounter for examination for normal comparison and control in clinical research program: Secondary | ICD-10-CM

## 2021-09-18 NOTE — Research (Signed)
[facility] Week 80 Visit   Patient Name Edward Medina Subject ID#   Visit Date 09/18/21  Non-Fatal Potential Endpoint Assessment Yes  No   Has the subject experienced/undergone any of the following since the last visit/contact?  []  [x]   Any Coronary Artery Revascularization/Cerebrovascular Revascularization/ Peripheral Artery Revascularization/Amputation Procedure  []  [x]   Myocardial Infarction []  [x]   Stroke  []  X  Provide the date for the non-fatal Potential Endpoints status:  []  [x]     Treatment compliance: Date returned: 09/18/21 Number returned from box# [id number] Number returned from box# [id number]: 4 Number returned-total: 7 Number of Full administrations:7  Number of partial administrations:0 Number of non administrations:0 Number of unknown administrations:0  Reason for any partial or non administrations:NA Number due to non compliance:NA Number due to device complaint:NA Number due to other:NA  IP Dispensation: Date dispensed 09/18/21 Number dispensed- total: 8 Number dispensed home to patient: 8 Box # [id number] Box # [id number]  IP Administration in [place] # ____ Administration time ____ [] AM [] PM in clinic Quantity Administered    [] None [] Partial [] Full Reason for None or Partial Dose ____ Administration Site [] Abdomen [] Arm [] Thigh Laterality [] Left  [] Right Administered by [] Patient [] Caregiver  COMMENTS: Patient isn't due for his injection until next week. He is going out of town and would not be able to make the visit so the decision was made for patient to come in the week before so that he would not run out of medication. Next appointment made for January 15, 2022 @ 8:30

## 2021-09-23 ENCOUNTER — Encounter: Payer: Self-pay | Admitting: Family Medicine

## 2021-09-23 ENCOUNTER — Other Ambulatory Visit: Payer: Self-pay

## 2021-09-23 DIAGNOSIS — H9190 Unspecified hearing loss, unspecified ear: Secondary | ICD-10-CM

## 2021-10-22 ENCOUNTER — Encounter: Payer: Self-pay | Admitting: Family Medicine

## 2021-10-28 ENCOUNTER — Other Ambulatory Visit: Payer: Self-pay

## 2021-10-28 ENCOUNTER — Ambulatory Visit (INDEPENDENT_AMBULATORY_CARE_PROVIDER_SITE_OTHER): Payer: 59 | Admitting: Family Medicine

## 2021-10-28 ENCOUNTER — Encounter: Payer: Self-pay | Admitting: Family Medicine

## 2021-10-28 VITALS — BP 113/77 | HR 60 | Temp 97.2°F | Wt 175.0 lb

## 2021-10-28 DIAGNOSIS — I2510 Atherosclerotic heart disease of native coronary artery without angina pectoris: Secondary | ICD-10-CM

## 2021-10-28 DIAGNOSIS — Z0001 Encounter for general adult medical examination with abnormal findings: Secondary | ICD-10-CM

## 2021-10-28 DIAGNOSIS — Z23 Encounter for immunization: Secondary | ICD-10-CM | POA: Diagnosis not present

## 2021-10-28 DIAGNOSIS — E785 Hyperlipidemia, unspecified: Secondary | ICD-10-CM | POA: Diagnosis not present

## 2021-10-28 DIAGNOSIS — H9311 Tinnitus, right ear: Secondary | ICD-10-CM

## 2021-10-28 DIAGNOSIS — R739 Hyperglycemia, unspecified: Secondary | ICD-10-CM | POA: Diagnosis not present

## 2021-10-28 DIAGNOSIS — N401 Enlarged prostate with lower urinary tract symptoms: Secondary | ICD-10-CM | POA: Diagnosis not present

## 2021-10-28 DIAGNOSIS — R351 Nocturia: Secondary | ICD-10-CM

## 2021-10-28 DIAGNOSIS — I2583 Coronary atherosclerosis due to lipid rich plaque: Secondary | ICD-10-CM | POA: Diagnosis not present

## 2021-10-28 DIAGNOSIS — Z6826 Body mass index (BMI) 26.0-26.9, adult: Secondary | ICD-10-CM

## 2021-10-28 DIAGNOSIS — N62 Hypertrophy of breast: Secondary | ICD-10-CM

## 2021-10-28 DIAGNOSIS — H00021 Hordeolum internum right upper eyelid: Secondary | ICD-10-CM

## 2021-10-28 LAB — COMPREHENSIVE METABOLIC PANEL
ALT: 54 U/L — ABNORMAL HIGH (ref 0–53)
AST: 32 U/L (ref 0–37)
Albumin: 4.3 g/dL (ref 3.5–5.2)
Alkaline Phosphatase: 104 U/L (ref 39–117)
BUN: 19 mg/dL (ref 6–23)
CO2: 30 mEq/L (ref 19–32)
Calcium: 9.6 mg/dL (ref 8.4–10.5)
Chloride: 101 mEq/L (ref 96–112)
Creatinine, Ser: 1.01 mg/dL (ref 0.40–1.50)
GFR: 79.51 mL/min (ref >60.00)
Glucose, Bld: 79 mg/dL (ref 70–99)
Potassium: 3.9 mEq/L (ref 3.5–5.1)
Sodium: 139 mEq/L (ref 135–145)
Total Bilirubin: 0.6 mg/dL (ref 0.2–1.2)
Total Protein: 7.3 g/dL (ref 6.0–8.3)

## 2021-10-28 LAB — LIPID PANEL
Cholesterol: 158 mg/dL (ref 0–200)
HDL: 54.1 mg/dL (ref >39.00)
LDL Cholesterol: 87 mg/dL (ref 0–99)
NonHDL: 103.89
Total CHOL/HDL Ratio: 3
Triglycerides: 86 mg/dL (ref 0.0–149.0)
VLDL: 17.2 mg/dL (ref 0.0–40.0)

## 2021-10-28 LAB — CBC
HCT: 45.8 % (ref 39.0–52.0)
Hemoglobin: 15.4 g/dL (ref 13.0–17.0)
MCHC: 33.7 g/dL (ref 30.0–36.0)
MCV: 89.3 fl (ref 78.0–100.0)
Platelets: 238 10*3/uL (ref 150.0–400.0)
RBC: 5.13 Mil/uL (ref 4.22–5.81)
RDW: 12.9 % (ref 11.5–15.5)
WBC: 3.5 10*3/uL — ABNORMAL LOW (ref 4.0–10.5)

## 2021-10-28 LAB — HEMOGLOBIN A1C: Hgb A1c MFr Bld: 5.9 % (ref 4.6–6.5)

## 2021-10-28 LAB — TSH: TSH: 1.05 u[IU]/mL (ref 0.35–5.50)

## 2021-10-28 NOTE — Progress Notes (Signed)
Chief Complaint:  Edward Medina is a 65 y.o. male who presents today for his annual comprehensive physical exam.    Assessment/Plan:  New/Acute Problems: Chalazion No red flags.  Given chronicity of symptoms will place referral to ophthalmology for further management. Can continue warm compresses.   Chronic Problems Addressed Today: Coronary artery disease due to lipid rich plaque Will refer to cardiology.  Former cardiologist moved out of state.  Cannot tolerate statins.  Check lipids today.  Hyperlipidemia LDL goal <100 Check lipids.   Benign prostatic hyperplasia Follows with urology.  On terazosin 10 mg daily and finasteride 5 mg daily.  He is having some gynecomastia.  Discussed potentially starting aromatase inhibitor however he deferred for now.   Tinnitus of right ear Normal exam.  He will be following up with ENT in a few weeks.  Gynecomastia Due to side effects of finasteride.  He does not wish to stop this medication as it is helping significantly with his BPH symptoms.  We discussed potentially mitigating gynecomastia with starting on an aromatse inhibitor such as anastrozole.  Discussed with patient that this is an off label use. He deferred for now.  Body mass index is 26.61 kg/m. / Overweight  BMI Metric Follow Up - 10/28/21 0855       BMI Metric Follow Up-Please document annually   BMI Metric Follow Up Education provided             Preventative Healthcare: Shingles vaccine given today. Check labs.   Patient Counseling(The following topics were reviewed and/or handout was given):  -Nutrition: Stressed importance of moderation in sodium/caffeine intake, saturated fat and cholesterol, caloric balance, sufficient intake of fresh fruits, vegetables, and fiber.  -Stressed the importance of regular exercise.   -Substance Abuse: Discussed cessation/primary prevention of tobacco, alcohol, or other drug use; driving or other dangerous activities under the  influence; availability of treatment for abuse.   -Injury prevention: Discussed safety belts, safety helmets, smoke detector, smoking near bedding or upholstery.   -Sexuality: Discussed sexually transmitted diseases, partner selection, use of condoms, avoidance of unintended pregnancy and contraceptive alternatives.   -Dental health: Discussed importance of regular tooth brushing, flossing, and dental visits.  -Health maintenance and immunizations reviewed. Please refer to Health maintenance section.  Return to care in 1 year for next preventative visit.     Subjective:  HPI:  See A/P for status of chronic conditions.  He has had a stye in his right upper eyelid for the last several weeks.  He saw his optometrist a few weeks ago who recommended warm compresses.  Symptoms have persisted.  Patient is normal.  Is also having some issues with clicking in his right ear.  This is different from his typical tinnitus.  He does have hearing loss but this is stable.  Lifestyle Diet: Balanced.  Exercise: 3-4 times per week 30 minutes at a time.   Depression screen PHQ 2/9 10/28/2021  Decreased Interest 0  Down, Depressed, Hopeless 0  PHQ - 2 Score 0    Health Maintenance Due  Topic Date Due   COVID-19 Vaccine (4 - Booster for Pfizer series) 10/15/2021     ROS: Per HPI, otherwise a complete review of systems was negative.   PMH:  The following were reviewed and entered/updated in epic: Past Medical History:  Diagnosis Date   FH: CAD (coronary artery disease) 12/24/2015   High cholesterol    Migraines    Patient Active Problem List   Diagnosis Date  Noted   Tinnitus of right ear 10/28/2021   Statin intolerance 10/27/2020   Constipation 10/27/2020   Gynecomastia 09/23/2017   Coronary artery disease due to lipid rich plaque 12/24/2015   Hyperlipidemia LDL goal <100 01/01/2015   Osteopenia 11/23/2013   Benign prostatic hyperplasia 12/15/2012   Past Surgical History:  Procedure  Laterality Date   HERNIA REPAIR  2005   TONSILLECTOMY  1967   WISDOM TOOTH EXTRACTION  1976    Family History  Problem Relation Age of Onset   Heart disease Father    Hyperlipidemia Father    Heart attack Father    Stroke Maternal Grandmother    Heart disease Maternal Grandfather     Medications- reviewed and updated Current Outpatient Medications  Medication Sig Dispense Refill   aluminum chloride (DRYSOL) 20 % external solution Apply topically at bedtime.     aspirin 81 MG tablet Take 81 mg by mouth daily.     Cholecalciferol (VITAMIN D3) 50 MCG (2000 UT) TABS Take 1 tablet by mouth daily.      COLLAGEN PO Adds as a daily supplement to coffee     finasteride (PROSCAR) 5 MG tablet Take 5 mg by mouth daily.     fish oil-omega-3 fatty acids 1000 MG capsule Take 2 g by mouth daily.      Multiple Vitamin (MULTI VITAMIN MENS PO) Take by mouth daily.     Saw Palmetto, Serenoa repens, (SAW PALMETTO PO) Take 1 tablet by mouth daily.      terazosin (HYTRIN) 10 MG capsule      No current facility-administered medications for this visit.    Allergies-reviewed and updated Allergies  Allergen Reactions   Atorvastatin Other (See Comments)    Memory impairment   Finasteride     Breast swelling    Pravastatin Other (See Comments)    Memory loss/impairment   Simvastatin Other (See Comments)    MADE HIM FEEL DISORIENTED   Amoxicillin Rash    SEVERE    Social History   Socioeconomic History   Marital status: Married    Spouse name: Not on file   Number of children: 2   Years of education: Not on file   Highest education level: Not on file  Occupational History   Occupation: IT professional  Tobacco Use   Smoking status: Never   Smokeless tobacco: Never  Vaping Use   Vaping Use: Never used  Substance and Sexual Activity   Alcohol use: Yes    Alcohol/week: 2.0 standard drinks    Types: 1 Glasses of wine, 1 Cans of beer per week    Comment: drink - beer, wine, and hard  liquor   Drug use: No   Sexual activity: Yes    Partners: Female    Comment: number of sex partners in the last 12 months - 1, birth control method  (pill)  Other Topics Concern   Not on file  Social History Narrative   Exercise indoor roller hockey 1-2 times per week for 2 hours   College   Married   Physicist, medical         Social Determinants of Health   Financial Resource Strain: Not on file  Food Insecurity: Not on file  Transportation Needs: Not on file  Physical Activity: Not on file  Stress: Not on file  Social Connections: Not on file        Objective:  Physical Exam: BP 113/77   Pulse 60   Temp (!)  97.2 F (36.2 C) (Temporal)   Wt 175 lb (79.4 kg)   SpO2 97%   BMI 26.61 kg/m   Body mass index is 26.61 kg/m. Wt Readings from Last 3 Encounters:  10/28/21 175 lb (79.4 kg)  09/15/21 174 lb 12.8 oz (79.3 kg)  10/27/20 173 lb 9.6 oz (78.7 kg)   Gen: NAD, resting comfortably HEENT: TMs normal bilaterally. OP clear. No thyromegaly noted.  Chalazion on right upper eyelid noted.  No erythema or edema in upper eyelid. CV: RRR with no murmurs appreciated Pulm: NWOB, CTAB with no crackles, wheezes, or rhonchi GI: Normal bowel sounds present. Soft, Nontender, Nondistended. MSK: no edema, cyanosis, or clubbing noted Skin: warm, dry Neuro: CN2-12 grossly intact. Strength 5/5 in upper and lower extremities. Reflexes symmetric and intact bilaterally.  Psych: Normal affect and thought content     Siobahn Worsley M. [name], MD 10/28/2021 9:03 AM

## 2021-10-28 NOTE — Assessment & Plan Note (Signed)
Will refer to cardiology.  Former cardiologist moved out of state.  Cannot tolerate statins.  Check lipids today.

## 2021-10-28 NOTE — Assessment & Plan Note (Signed)
Check lipids 

## 2021-10-28 NOTE — Assessment & Plan Note (Signed)
Follows with urology.  On terazosin 10 mg daily and finasteride 5 mg daily.  He is having some gynecomastia.  Discussed potentially starting aromatase inhibitor however he deferred for now.

## 2021-10-28 NOTE — Assessment & Plan Note (Signed)
Due to side effects of finasteride.  He does not wish to stop this medication as it is helping significantly with his BPH symptoms.  We discussed potentially mitigating gynecomastia with starting on an aromatse inhibitor such as anastrozole.  Discussed with patient that this is an off label use. He deferred for now.

## 2021-10-28 NOTE — Patient Instructions (Signed)
It was very nice to see you today!  We we will refer you to see an ophthalmologist and cardiologist.  Please follow-up with ENT.  We will check blood work today and give your shingles vaccine.  I will see you back in 1 year for your next annual physical.  Please come back sooner if needed.  Take care, Dr [name]  PLEASE NOTE:  If you had any lab tests please let us know if you have not heard back within a few days. You may see your results on mychart before we have a chance to review them but we will give you a call once they are reviewed by [place]. If we ordered any referrals today, please let us know if you have not heard from their office within the next week.   Please try these tips to maintain a healthy lifestyle:  Eat at least 3 REAL meals and 1-2 snacks per day.  Aim for no more than 5 hours between eating.  If you eat breakfast, please do so within one hour of getting up.   Each meal should contain half fruits/vegetables, one quarter protein, and one quarter carbs (no bigger than a computer mouse)  Cut down on sweet beverages. This includes juice, soda, and sweet tea.   Drink at least 1 glass of water with each meal and aim for at least 8 glasses per day  Exercise at least 150 minutes every week.    Preventive Care 63-33 Years Old, Male Preventive care refers to lifestyle choices and visits with your health care provider that can promote health and wellness. Preventive care visits are also called wellness exams. What can I expect for my preventive care visit? Counseling During your preventive care visit, your health care provider may ask about your: Medical history, including: Past medical problems. Family medical history. Current health, including: Emotional well-being. Home life and relationship well-being. Sexual activity. Lifestyle, including: Alcohol, nicotine or tobacco, and drug use. Access to firearms. Diet, exercise, and sleep habits. Safety issues such as  seatbelt and bike helmet use. Sunscreen use. Work and work Statistician. Physical exam Your health care provider will check your: Height and weight. These may be used to calculate your BMI (body mass index). BMI is a measurement that tells if you are at a healthy weight. Waist circumference. This measures the distance around your waistline. This measurement also tells if you are at a healthy weight and may help predict your risk of certain diseases, such as type 2 diabetes and high blood pressure. Heart rate and blood pressure. Body temperature. Skin for abnormal spots. What immunizations do I need? Vaccines are usually given at various ages, according to a schedule. Your health care provider will recommend vaccines for you based on your age, medical history, and lifestyle or other factors, such as travel or where you work. What tests do I need? Screening Your health care provider may recommend screening tests for certain conditions. This may include: Lipid and cholesterol levels. Diabetes screening. This is done by checking your blood sugar (glucose) after you have not eaten for a while (fasting). Hepatitis B test. Hepatitis C test. HIV (human immunodeficiency virus) test. STI (sexually transmitted infection) testing, if you are at risk. Lung cancer screening. Prostate cancer screening. Colorectal cancer screening. Talk with your health care provider about your test results, treatment options, and if necessary, the need for more tests. Follow these instructions at home: Eating and drinking  Eat a diet that includes fresh fruits and vegetables,  whole grains, lean protein, and low-fat dairy products. Take vitamin and mineral supplements as recommended by your health care provider. Do not drink alcohol if your health care provider tells you not to drink. If you drink alcohol: Limit how much you have to 0-2 drinks a day. Know how much alcohol is in your drink. In the [place] one drink  equals one 12 oz bottle of beer (355 mL), one 5 oz glass of wine (148 mL), or one 1 oz glass of hard liquor (44 mL). Lifestyle Brush your teeth every morning and night with fluoride toothpaste. Floss one time each day. Exercise for at least 30 minutes 5 or more days each week. Do not use any products that contain nicotine or tobacco. These products include cigarettes, chewing tobacco, and vaping devices, such as e-cigarettes. If you need help quitting, ask your health care provider. Do not use drugs. If you are sexually active, practice safe sex. Use a condom or other form of protection to prevent STIs. Take aspirin only as told by your health care provider. Make sure that you understand how much to take and what form to take. Work with your health care provider to find out whether it is safe and beneficial for you to take aspirin daily. Find healthy ways to manage stress, such as: Meditation, yoga, or listening to music. Journaling. Talking to a trusted person. Spending time with friends and family. Minimize exposure to UV radiation to reduce your risk of skin cancer. Safety Always wear your seat belt while driving or riding in a vehicle. Do not drive: If you have been drinking alcohol. Do not ride with someone who has been drinking. When you are tired or distracted. While texting. If you have been using any mind-altering substances or drugs. Wear a helmet and other protective equipment during sports activities. If you have firearms in your house, make sure you follow all gun safety procedures. What's next? Go to your health care provider once a year for an annual wellness visit. Ask your health care provider how often you should have your eyes and teeth checked. Stay up to date on all vaccines. This information is not intended to replace advice given to you by your health care provider. Make sure you discuss any questions you have with your health care provider. Document Revised:  06/03/2021 Document Reviewed: 06/03/2021 Elsevier Patient Education  [place].

## 2021-10-28 NOTE — Assessment & Plan Note (Signed)
Normal exam.  He will be following up with ENT in a few weeks.

## 2021-10-29 NOTE — Progress Notes (Signed)
Please inform patient of the following:  Bloodsugar is borderline elevated.  Everything else is stable. Do not need to make any changes to treatment plan at this time. He should continue working on diet and exercise and we can recheck in a year.

## 2021-11-02 NOTE — Progress Notes (Signed)
Cardiology Office Note:    Date:  11/04/2021   ID:  [name], DOB Aug 29, 1956, MRN [id number]  PCP:  Vivi Barrack, MD  Cardiologist:  [name], MD  Electrophysiologist:  None   Referring MD: Vivi Barrack, MD   Chief Complaint/Reason for Referral: Establish cardiovascular care  History of Present Illness:    PROBLEM LIST: 1.  Coronary artery disease per coronary calcium on CT scan 2021 2.  Hyperlipidemia intolerant of multiple statins 3.  Family history of early myocardial infarction   ASIM GERSTEN is a 65 y.o. male with the indicated history here to establish general cardiology care.  The patient previously been seen several years ago and was doing well.  His biggest issue is that he is intolerant of statins.  He has been tried on 3 different statins.  He did have lipids checked recently which demonstrated an LDL of 87.  His total cholesterol is 158.  He is doing quite well.  He denies any exertional angina, dyspnea on exertion, presyncope, syncope, or palpitations.  He has had no severe bleeding or bruising.  He exercises on his treadmill 3-4 times a week without any issues.  He is required no hospitalizations or emergency room visits.  Past Medical History:  Diagnosis Date   FH: CAD (coronary artery disease) 12/24/2015   High cholesterol    Migraines     Past Surgical History:  Procedure Laterality Date   HERNIA REPAIR  2005   TONSILLECTOMY  1967   WISDOM TOOTH EXTRACTION  1976    Current Medications: Current Meds  Medication Sig   aluminum chloride (DRYSOL) 20 % external solution Apply topically at bedtime.   aspirin 81 MG tablet Take 81 mg by mouth daily.   Cholecalciferol (VITAMIN D3) 50 MCG (2000 UT) TABS Take 1 tablet by mouth daily.    COLLAGEN PO Adds as a daily supplement to coffee   finasteride (PROSCAR) 5 MG tablet Take 5 mg by mouth daily.   fish oil-omega-3 fatty acids 1000 MG capsule Take 2 g by mouth daily.    Multiple Vitamin  (MULTI VITAMIN MENS PO) Take by mouth daily.   nitroGLYCERIN (NITROSTAT) 0.4 MG SL tablet Place 1 tablet (0.4 mg total) under the tongue every 5 (five) minutes as needed for chest pain.   Saw Palmetto, Serenoa repens, (SAW PALMETTO PO) Take 1 tablet by mouth daily.    terazosin (HYTRIN) 10 MG capsule Take 10 mg by mouth daily.     Allergies:    Allergies  Allergen Reactions   Atorvastatin Other (See Comments)    Memory impairment   Finasteride     Breast swelling    Pravastatin Other (See Comments)    Memory loss/impairment   Simvastatin Other (See Comments)    MADE HIM FEEL DISORIENTED   Amoxicillin Rash    SEVERE    Social History   Tobacco Use   Smoking status: Never   Smokeless tobacco: Never  Vaping Use   Vaping Use: Never used  Substance Use Topics   Alcohol use: Yes    Alcohol/week: 2.0 standard drinks    Types: 1 Glasses of wine, 1 Cans of beer per week    Comment: drink - beer, wine, and hard liquor-occassionally   Drug use: No     Family History: Family History  Problem Relation Age of Onset   Heart disease Father    Hyperlipidemia Father    Heart attack Father    Stroke  Maternal Grandmother    Heart disease Maternal Grandfather      ROS:   Please see the history of present illness.    All other systems reviewed and are negative.  EKGs/Labs/Other Studies Reviewed:    The following studies were reviewed today:  EKG: EKG demonstrates a sinus rhythm with no Q waves or ST changes.  Imaging studies that I have independently reviewed today:   2021 CT IMPRESSION: Coronary calcium score of 198. This was 62 percentile for age and sex matched control.  Recent Labs: 10/28/2021: ALT 54; BUN 19; Creatinine, Ser 1.01; Hemoglobin 15.4; Platelets 238.0; Potassium 3.9; Sodium 139; TSH 1.05  Recent Lipid Panel    Component Value Date/Time   CHOL 158 10/28/2021 0909   TRIG 86.0 10/28/2021 0909   HDL 54.10 10/28/2021 0909   CHOLHDL 3 10/28/2021 0909    VLDL 17.2 10/28/2021 0909   LDLCALC 87 10/28/2021 0909   LDLCALC 73 10/27/2020 1008   LDLDIRECT 140.0 09/23/2017 1201    Physical Exam:    VS:  BP 100/60   Pulse 68   Ht 5\' 9"  (1.753 m)   Wt 174 lb (78.9 kg)   BMI 25.70 kg/m     Wt Readings from Last 5 Encounters:  11/04/21 174 lb (78.9 kg)  10/28/21 175 lb (79.4 kg)  09/15/21 174 lb 12.8 oz (79.3 kg)  10/27/20 173 lb 9.6 oz (78.7 kg)  03/05/20 177 lb 9.6 oz (80.6 kg)    GENERAL:  No apparent distress, AOx3 HEENT:  No carotid bruits, +2 carotid impulses, no scleral icterus CAR: RRR no murmurs, gallops, rubs, or thrills RES:  Clear to auscultation bilaterally ABD:  Soft, nontender, nondistended, positive bowel sounds x 4 VASC:  +2 radial pulses, +2 carotid pulses, palpable pedal pulses NEURO:  CN 2-12 grossly intact; motor and sensory grossly intact PSYCH:  No active depression or anxiety EXT:  No edema, ecchymosis, or cyanosis  ASSESSMENT:    1. Coronary artery disease involving native coronary artery of native heart without angina pectoris   2. Hyperlipidemia, unspecified hyperlipidemia type    PLAN:    Coronary artery disease involving native coronary artery of native heart without angina pectoris - Enrolled in clinical trial for Repatha, aspirin, and will prescribe as needed nitroglycerin.  Follow up in 1 year.    Hyperlipidemia, unspecified hyperlipidemia type  Enrolled in Repatha trial.  He is on high-dose fish oil.  While his LDL is less than 100 I think it would be beneficial for him to be less than 70.  Will check next year.   Shared Decision Making/Informed Consent:     Medication Adjustments/Labs and Tests Ordered: Current medicines are reviewed at length with the patient today.  Concerns regarding medicines are outlined above.   Orders Placed This Encounter  Procedures   EKG 12-Lead     Meds ordered this encounter  Medications   nitroGLYCERIN (NITROSTAT) 0.4 MG SL tablet    Sig: Place 1 tablet  (0.4 mg total) under the tongue every 5 (five) minutes as needed for chest pain.    Dispense:  25 tablet    Refill:  3     Patient Instructions  Medication Instructions:  Your physician has recommended you make the following change in your medication:   START Nitroglycerin 0.4 s/l tablets.. use as directed only as needed for chest pain   *If you need a refill on your cardiac medications before your next appointment, please call your pharmacy*   Lab  Work: None ordered  If you have labs (blood work) drawn today and your tests are completely normal, you will receive your results only by: [place] (if you have MyChart) OR A paper copy in the mail If you have any lab test that is abnormal or we need to change your treatment, we will call you to review the results.   Testing/Procedures: None ordered   Follow-Up: At [facility], you and your health needs are our priority.  As part of our continuing mission to provide you with exceptional heart care, we have created designated Provider Care Teams.  These Care Teams include your primary Cardiologist (physician) and Advanced Practice Providers (APPs -  Physician Assistants and Nurse Practitioners) who all work together to provide you with the care you need, when you need it.  We recommend signing up for the patient portal called "MyChart".  Sign up information is provided on this After Visit Summary.  MyChart is used to connect with patients for Virtual Visits (Telemedicine).  Patients are able to view lab/test results, encounter notes, upcoming appointments, etc.  Non-urgent messages can be sent to your provider as well.   To learn more about what you can do with MyChart, go to [email or web address].    Your next appointment:   12 month(s)  The format for your next appointment:   In Person  Provider:   [name], MD     Other Instructions

## 2021-11-04 ENCOUNTER — Ambulatory Visit (INDEPENDENT_AMBULATORY_CARE_PROVIDER_SITE_OTHER): Payer: 59 | Admitting: Internal Medicine

## 2021-11-04 ENCOUNTER — Encounter: Payer: Self-pay | Admitting: Internal Medicine

## 2021-11-04 ENCOUNTER — Other Ambulatory Visit: Payer: Self-pay

## 2021-11-04 VITALS — BP 100/60 | HR 68 | Ht 69.0 in | Wt 174.0 lb

## 2021-11-04 DIAGNOSIS — I2510 Atherosclerotic heart disease of native coronary artery without angina pectoris: Secondary | ICD-10-CM | POA: Diagnosis not present

## 2021-11-04 DIAGNOSIS — E785 Hyperlipidemia, unspecified: Secondary | ICD-10-CM

## 2021-11-04 MED ORDER — NITROGLYCERIN 0.4 MG SL SUBL: 0.4000 mg | SUBLINGUAL_TABLET | SUBLINGUAL | 3 refills | Status: DC | PRN

## 2021-11-04 NOTE — Addendum Note (Signed)
Addended by: Gaetano Net on: 11/04/2021 09:35 AM   Modules accepted: Orders

## 2021-11-04 NOTE — Patient Instructions (Signed)
Medication Instructions:  Your physician has recommended you make the following change in your medication:   START Nitroglycerin 0.4 s/l tablets.. use as directed only as needed for chest pain   *If you need a refill on your cardiac medications before your next appointment, please call your pharmacy*   Lab Work: None ordered  If you have labs (blood work) drawn today and your tests are completely normal, you will receive your results only by: [place] (if you have MyChart) OR A paper copy in the mail If you have any lab test that is abnormal or we need to change your treatment, we will call you to review the results.   Testing/Procedures: None ordered   Follow-Up: At [facility], you and your health needs are our priority.  As part of our continuing mission to provide you with exceptional heart care, we have created designated Provider Care Teams.  These Care Teams include your primary Cardiologist (physician) and Advanced Practice Providers (APPs -  Physician Assistants and Nurse Practitioners) who all work together to provide you with the care you need, when you need it.  We recommend signing up for the patient portal called "MyChart".  Sign up information is provided on this After Visit Summary.  MyChart is used to connect with patients for Virtual Visits (Telemedicine).  Patients are able to view lab/test results, encounter notes, upcoming appointments, etc.  Non-urgent messages can be sent to your provider as well.   To learn more about what you can do with MyChart, go to [email or web address].    Your next appointment:   12 month(s)  The format for your next appointment:   In Person  Provider:   [name], MD     Other Instructions

## 2021-11-23 ENCOUNTER — Other Ambulatory Visit: Payer: Self-pay | Admitting: Otolaryngology

## 2021-11-23 DIAGNOSIS — H903 Sensorineural hearing loss, bilateral: Secondary | ICD-10-CM

## 2021-12-06 ENCOUNTER — Ambulatory Visit
Admission: RE | Admit: 2021-12-06 | Discharge: 2021-12-06 | Disposition: A | Discharge status: Home / Self Care | Payer: 59 | Source: Ambulatory Visit | Attending: Otolaryngology | Admitting: Otolaryngology

## 2021-12-06 DIAGNOSIS — H903 Sensorineural hearing loss, bilateral: Secondary | ICD-10-CM

## 2021-12-06 MED ORDER — GADOBENATE DIMEGLUMINE 529 MG/ML IV SOLN
15.0000 mL | Freq: Once | INTRAVENOUS | Status: AC | PRN
  Administered 2021-12-06: 10:00:00 15 mL via INTRAVENOUS

## 2021-12-07 DIAGNOSIS — D333 Benign neoplasm of cranial nerves: Secondary | ICD-10-CM

## 2021-12-07 HISTORY — DX: Benign neoplasm of cranial nerves: D33.3

## 2022-01-15 ENCOUNTER — Other Ambulatory Visit: Payer: Self-pay

## 2022-01-15 DIAGNOSIS — Z006 Encounter for examination for normal comparison and control in clinical research program: Secondary | ICD-10-CM

## 2022-01-15 MED ORDER — STUDY - VESALIUS - EVOLOCUMAB (AMG 145) 140 MG/ML OR PLACEBO SQ INJECTION (PI-HILTY)
140.0000 mg | INJECTION | SUBCUTANEOUS | Status: DC
  Filled 2022-01-15: qty 1

## 2022-01-15 MED ORDER — STUDY - VESALIUS - EVOLOCUMAB (AMG 145) 140 MG/ML OR PLACEBO SQ INJECTION (PI-HILTY): 140.0000 mg | INJECTION | SUBCUTANEOUS | Status: DC

## 2022-01-15 NOTE — Research (Signed)
Patient seen in clinic for Week 96 of Vesalius trial. Denies any SAEs/Aes since last visit. Reviewed medications with patient with no changes. Denies any recent procedures and hospitalizations. IP not given in clinic at patient request. Stated he wanted to take at home at the time he usually gives himself the injection. Patient to return for Week 112 on May 07, 2022

## 2022-01-15 NOTE — Research (Signed)
Vesalius-CV Week 96 Visit   Patient Name Edward Medina Subject ZJ#[id number]  Visit Date 01/15/22  Non-Fatal Potential Endpoint Assessment Yes  No   Has the subject experienced/undergone any of the following since the last visit/contact?  []  [x]   Any Coronary Artery Revascularization/Cerebrovascular Revascularization/ Peripheral Artery Revascularization/Amputation Procedure  []  [x]   Myocardial Infarction []  [x]   Stroke  []  x  Provide the date for the non-fatal Potential Endpoints status:  []  [x]     Treatment compliance: Date returned: 01/15/22 Number returned from box# :[id number] Number returned from box# 8  Number returned-total: 8 Number of Full administrations:8  Number of partial administrations:0 Number of non administrations:0 Number of unknown administrations:0  Reason for any partial or non administrations:NA Number due to non compliance: Number due to device complaint: Number due to other:  IP Dispensation: Date dispensed 01/15/22 Number dispensed- total: 8 Number dispensed home to patient: 7 Box # _____ Box # _____  IP Administration in Clinic:NA NOT GIVEN IN CLINIC-PATIENT REQUESTED TO TAKE AT [facility] Box # ____ Administration time ____ [] AM [] PM in clinic Quantity Administered    [] None [] Partial [] Full Reason for None or Partial Dose ____ Administration Site [] Abdomen [] Arm [] Thigh Laterality [] Left  [] Right Administered by [] Patient [] Caregiver

## 2022-01-21 ENCOUNTER — Ambulatory Visit (INDEPENDENT_AMBULATORY_CARE_PROVIDER_SITE_OTHER): Payer: 59 | Admitting: *Deleted

## 2022-01-21 ENCOUNTER — Other Ambulatory Visit: Payer: Self-pay

## 2022-01-21 DIAGNOSIS — Z23 Encounter for immunization: Secondary | ICD-10-CM | POA: Diagnosis not present

## 2022-01-21 NOTE — Progress Notes (Signed)
I have reviewed the patient's encounter and agree with the documentation.  Edward Medina. [name], MD 01/21/2022 9:34 AM

## 2022-01-21 NOTE — Progress Notes (Signed)
Patient came into the office for his 2nd Shingles vaccine per orders of Dr. [name]. Injection given in left deltoid per patient preference. Patient tolerated well.   Zacarias Pontes, CMA

## 2022-01-22 ENCOUNTER — Telehealth: Payer: Self-pay

## 2022-01-22 NOTE — Telephone Encounter (Signed)
Patient notified of lab work being destroyed during his 1 year visit. Lipids were drawn but were part of the sub study that patient hadn't signed consent for. Patient verbalized understanding

## 2022-04-20 ENCOUNTER — Other Ambulatory Visit: Payer: Self-pay | Admitting: Otolaryngology

## 2022-04-20 DIAGNOSIS — D333 Benign neoplasm of cranial nerves: Secondary | ICD-10-CM

## 2022-05-07 DIAGNOSIS — Z006 Encounter for examination for normal comparison and control in clinical research program: Secondary | ICD-10-CM

## 2022-05-07 MED ORDER — STUDY - VESALIUS - EVOLOCUMAB (AMG 145) 140 MG/ML OR PLACEBO SQ INJECTION (PI-HILTY): 140.0000 mg | INJECTION | SUBCUTANEOUS | 0 refills | Status: DC

## 2022-05-07 MED ORDER — STUDY - VESALIUS - EVOLOCUMAB (AMG 145) 140 MG/ML OR PLACEBO SQ INJECTION (PI-HILTY)
140.0000 mg | INJECTION | SUBCUTANEOUS | Status: DC
  Administered 2022-05-07: 140 mg via SUBCUTANEOUS
  Filled 2022-05-07: qty 1

## 2022-05-07 NOTE — Research (Signed)
Patient seen in the research clinic today for week 112 of Vesalius trial. Patient didn't receive boxes at last visit to bring back. States he just received the IP in a bag. He was given 2 boxes today. Box [id number] was used to give one injection in the clinic today. Reviewed verbally on how to administer IP with patient. He verbalized understanding and was able to administer the injection correctly to himself. Denies any recent adverse events or hospitalizations. Medications reviewed and patient states that his teazosin was decreased to '5mg'$  a day.      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 Has had hearing loss in the right ear before he began the study but he started noticing crackling in the year since last summer. Dr Benjamine Mola is the ENT following the patient for this. States no treatment at this time and he is having an MRI completed every 6 months to keep a check on the tumor which is benign.     Next appointment for week 128 made for Sept 8th @ 0830  Vesalius Treatment Compliance Date returned:05/07/2022 Box Numbers:no boxes returned, numbers given [phone] Number of syringes returned: 8 Number of Full administrations:8  Number of partial administrations:0 Number of non-administrations:0 Number on unknown administrations:0  Reason for any partial or non-administrations: N/A Number due to non-compliance: Number due to device complaint: Number to do other:  IP Dispensation: Date dispensed: 05/07/2022 Number dispensed total: 8 Number dispensed home to patient:7 Box #:[id number] syringes Box#: [id number] syringes  IP Administration in Clinic: Box #:[id number] Administration time: 0855 Amount administered (none, partial, full):full Reason for none or partial dose: Administration Site: lower quadrant abdomen Laterally:left Administered by: patient   Current Outpatient Medications:    aluminum chloride (DRYSOL) 20 %  external solution, Apply topically at bedtime., Disp: , Rfl:    aspirin 81 MG tablet, Take 81 mg by mouth daily., Disp: , Rfl:    Cholecalciferol (VITAMIN D3) 50 MCG (2000 UT) TABS, Take 1 tablet by mouth daily. , Disp: , Rfl:    COLLAGEN PO, Adds as a daily supplement to coffee, Disp: , Rfl:    finasteride (PROSCAR) 5 MG tablet, Take 5 mg by mouth daily., Disp: , Rfl:    fish oil-omega-3 fatty acids 1000 MG capsule, Take 2 g by mouth daily. , Disp: , Rfl:    Multiple Vitamin (MULTI VITAMIN MENS PO), Take by mouth daily., Disp: , Rfl:    Saw Palmetto, Serenoa repens, (SAW PALMETTO PO), Take 1 tablet by mouth daily. , Disp: , Rfl:    Study - VESALIUS - evolocumab (AMG 145) 140 mg/mL or placebo SQ injection (PI-Hilty), Inject 1 mL (140 mg total) into the skin every 14 (fourteen) days. For Investigational Use Only. Inject 1 mL (contents of one pen) subcutaneously every 2 weeks. Return kits at next visit. (Patient taking differently: Inject 140 mg into the skin every 14 (fourteen) days. For Investigational Use Only. Inject 1 mL (contents of one pen) subcutaneously every 2 weeks. Return kits at next visit. Please contact [facility] with any questions or concerns regarding this study medication.), Disp: 7 mL, Rfl: 0   nitroGLYCERIN (NITROSTAT) 0.4 MG SL tablet, Place 1 tablet (0.4 mg total) under the tongue every 5 (five) minutes as needed for chest pain., Disp: 25 tablet, Rfl: 3   terazosin (HYTRIN) 10 MG capsule, Take 5 mg by mouth daily., Disp: , Rfl:   Current Facility-Administered Medications:    Study -  VESALIUS - evolocumab (AMG 145) 140 mg/mL or placebo SQ injection (PI-Hilty), 140 mg, Subcutaneous, Q14 Days, Hilty, Nadean Corwin, MD, 140 mg at 05/07/22 [phone]

## 2022-05-07 NOTE — Research (Signed)
VESALIUS Informed Consent   Subject Name: Edward Medina  Subject met inclusion and exclusion criteria.  The informed consent form, study requirements and expectations were reviewed with the subject and questions and concerns were addressed prior to the signing of the consent form.  The subject verbalized understanding of the trial requirements.  The subject agreed to participate in the VESALIUS trial and signed the informed consent at 0835 on 05/07/2022.  The informed consent was obtained prior to performance of any protocol-specific procedures for the subject.  A copy of the signed informed consent was given to the subject and a copy was placed in the subject's medical record.   [place]  Consent Version 6 Protocol Version 6 amendment 5

## 2022-05-08 ENCOUNTER — Ambulatory Visit
Admission: RE | Admit: 2022-05-08 | Discharge: 2022-05-08 | Disposition: A | Discharge status: Home / Self Care | Payer: 59 | Source: Ambulatory Visit | Attending: Otolaryngology | Admitting: Otolaryngology

## 2022-05-08 DIAGNOSIS — D333 Benign neoplasm of cranial nerves: Secondary | ICD-10-CM

## 2022-05-08 MED ORDER — GADOBENATE DIMEGLUMINE 529 MG/ML IV SOLN
17.0000 mL | Freq: Once | INTRAVENOUS | Status: AC | PRN
  Administered 2022-05-08: 17 mL via INTRAVENOUS

## 2022-06-24 ENCOUNTER — Encounter: Payer: Self-pay | Admitting: Family Medicine

## 2022-07-16 ENCOUNTER — Other Ambulatory Visit: Payer: Self-pay

## 2022-07-16 ENCOUNTER — Encounter (HOSPITAL_BASED_OUTPATIENT_CLINIC_OR_DEPARTMENT_OTHER): Payer: Self-pay | Admitting: Ophthalmology

## 2022-07-21 ENCOUNTER — Ambulatory Visit: Payer: Self-pay | Admitting: Ophthalmology

## 2022-07-21 NOTE — H&P (Signed)
Date of examination:  07/14/22  Indication for surgery: longstanding left exotropia  Pertinent past medical history:  Past Medical History:  Diagnosis Date   Acoustic neuroma ([place])    FH: CAD (coronary artery disease) 12/24/2015   High cholesterol    Migraines     Pertinent ocular history:  66yo male with childhood history of strabismus and amblyopia, treated with patching and glasses. No known prior surgeries. 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 Patient experiences significant social and self-esteem issues secondary to the eye turn; he does frequent online meetings and feels the eye is even more noticeable during these work encounters.  Pertinent family history:  Family History  Problem Relation Age of Onset   Heart disease Father    Hyperlipidemia Father    Heart attack Father    Stroke Maternal Grandmother    Heart disease Maternal Grandfather     General:  Healthy appearing patient in no distress.   Eyes:    Acuity OD 20/15  OS 20/25  cc   External: Within normal limits    Anterior segment: Trace nuclear sclerosis OU  Motility:   45pd LXT  Impression: 66yo male with mild amblyopia left eye; suppression of left eye; desire for repair for psychosocial reasons   Plan: Bilateral strabismic repair  Thomes Cake, MD

## 2022-07-21 NOTE — H&P (View-Only) (Signed)
Date of examination:  07/14/22  Indication for surgery: longstanding left exotropia  Pertinent past medical history:  Past Medical History:  Diagnosis Date   Acoustic neuroma ([place])    FH: CAD (coronary artery disease) 12/24/2015   High cholesterol    Migraines     Pertinent ocular history:  66yo male with childhood history of strabismus and amblyopia, treated with patching and glasses. No known prior surgeries. 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 Patient experiences significant social and self-esteem issues secondary to the eye turn; he does frequent online meetings and feels the eye is even more noticeable during these work encounters.  Pertinent family history:  Family History  Problem Relation Age of Onset   Heart disease Father    Hyperlipidemia Father    Heart attack Father    Stroke Maternal Grandmother    Heart disease Maternal Grandfather     General:  Healthy appearing patient in no distress.   Eyes:    Acuity OD 20/15  OS 20/25  cc   External: Within normal limits    Anterior segment: Trace nuclear sclerosis OU  Motility:   45pd LXT  Impression: 65yo male with mild amblyopia left eye; suppression of left eye; desire for repair for psychosocial reasons   Plan: Bilateral strabismic repair  Thomes Cake, MD

## 2022-07-23 ENCOUNTER — Ambulatory Visit (HOSPITAL_BASED_OUTPATIENT_CLINIC_OR_DEPARTMENT_OTHER)
Admission: RE | Admit: 2022-07-23 | Discharge: 2022-07-23 | Disposition: A | Discharge status: Home / Self Care | Payer: 59 | Attending: Ophthalmology | Admitting: Ophthalmology

## 2022-07-23 ENCOUNTER — Other Ambulatory Visit: Payer: Self-pay

## 2022-07-23 ENCOUNTER — Encounter (HOSPITAL_BASED_OUTPATIENT_CLINIC_OR_DEPARTMENT_OTHER): Payer: Self-pay | Admitting: Ophthalmology

## 2022-07-23 ENCOUNTER — Ambulatory Visit (HOSPITAL_BASED_OUTPATIENT_CLINIC_OR_DEPARTMENT_OTHER): Payer: 59 | Admitting: Anesthesiology

## 2022-07-23 ENCOUNTER — Encounter (HOSPITAL_BASED_OUTPATIENT_CLINIC_OR_DEPARTMENT_OTHER)
Admission: RE | Disposition: A | Discharge status: Home / Self Care | Payer: Self-pay | Source: Home / Self Care | Attending: Ophthalmology

## 2022-07-23 DIAGNOSIS — H53002 Unspecified amblyopia, left eye: Secondary | ICD-10-CM | POA: Diagnosis not present

## 2022-07-23 DIAGNOSIS — H50112 Monocular exotropia, left eye: Secondary | ICD-10-CM | POA: Diagnosis present

## 2022-07-23 DIAGNOSIS — H5010 Unspecified exotropia: Secondary | ICD-10-CM

## 2022-07-23 HISTORY — DX: Benign neoplasm of cranial nerves: D33.3

## 2022-07-23 HISTORY — PX: STRABISMUS SURGERY: SHX218

## 2022-07-23 SURGERY — STRABISMUS SURGERY, BILATERAL
Anesthesia: General | Site: Eye | Laterality: Bilateral

## 2022-07-23 MED ORDER — DEXAMETHASONE SODIUM PHOSPHATE 4 MG/ML IJ SOLN
INTRAMUSCULAR | Status: DC | PRN
  Administered 2022-07-23: 8 mg via INTRAVENOUS

## 2022-07-23 MED ORDER — TOBRAMYCIN-DEXAMETHASONE 0.3-0.1 % OP SUSP: 1.0000 [drp] | Freq: Four times a day (QID) | OPHTHALMIC | 0 refills | Status: AC

## 2022-07-23 MED ORDER — ACETAMINOPHEN 160 MG/5ML PO SOLN: 325.0000 mg | ORAL | Status: DC | PRN

## 2022-07-23 MED ORDER — GLYCOPYRROLATE 0.2 MG/ML IJ SOLN
INTRAMUSCULAR | Status: DC | PRN
  Administered 2022-07-23: .2 mg via INTRAVENOUS

## 2022-07-23 MED ORDER — MIDAZOLAM HCL 5 MG/5ML IJ SOLN
INTRAMUSCULAR | Status: DC | PRN
  Administered 2022-07-23: 2 mg via INTRAVENOUS

## 2022-07-23 MED ORDER — FENTANYL CITRATE (PF) 100 MCG/2ML IJ SOLN
INTRAMUSCULAR | Status: AC
  Filled 2022-07-23: qty 2

## 2022-07-23 MED ORDER — MIDAZOLAM HCL 2 MG/2ML IJ SOLN
INTRAMUSCULAR | Status: AC
  Filled 2022-07-23: qty 2

## 2022-07-23 MED ORDER — AMISULPRIDE (ANTIEMETIC) 5 MG/2ML IV SOLN: 10.0000 mg | Freq: Once | INTRAVENOUS | Status: DC | PRN

## 2022-07-23 MED ORDER — 0.9 % SODIUM CHLORIDE (POUR BTL) OPTIME
TOPICAL | Status: DC | PRN
  Administered 2022-07-23: 200 mL

## 2022-07-23 MED ORDER — FENTANYL CITRATE (PF) 100 MCG/2ML IJ SOLN: 25.0000 ug | INTRAMUSCULAR | Status: DC | PRN

## 2022-07-23 MED ORDER — PROMETHAZINE HCL 25 MG/ML IJ SOLN: 6.2500 mg | INTRAMUSCULAR | Status: DC | PRN

## 2022-07-23 MED ORDER — PHENYLEPHRINE HCL 2.5 % OP SOLN
OPHTHALMIC | Status: DC | PRN
  Administered 2022-07-23: 2 [drp] via OPHTHALMIC

## 2022-07-23 MED ORDER — OXYCODONE HCL 5 MG/5ML PO SOLN: 5.0000 mg | Freq: Once | ORAL | Status: DC | PRN

## 2022-07-23 MED ORDER — LACTATED RINGERS IV SOLN: INTRAVENOUS | Status: DC

## 2022-07-23 MED ORDER — LIDOCAINE HCL (CARDIAC) PF 100 MG/5ML IV SOSY
PREFILLED_SYRINGE | INTRAVENOUS | Status: DC | PRN
  Administered 2022-07-23: 60 mg via INTRAVENOUS

## 2022-07-23 MED ORDER — FENTANYL CITRATE (PF) 100 MCG/2ML IJ SOLN
INTRAMUSCULAR | Status: DC | PRN
  Administered 2022-07-23 (×2): 50 ug via INTRAVENOUS

## 2022-07-23 MED ORDER — BUPIVACAINE HCL (PF) 0.5 % IJ SOLN
INTRAMUSCULAR | Status: DC | PRN
  Administered 2022-07-23: 3 mL

## 2022-07-23 MED ORDER — ACETAMINOPHEN 325 MG PO TABS: 325.0000 mg | ORAL_TABLET | ORAL | Status: DC | PRN

## 2022-07-23 MED ORDER — KETOROLAC TROMETHAMINE 30 MG/ML IJ SOLN
INTRAMUSCULAR | Status: DC | PRN
  Administered 2022-07-23: 30 mg via INTRAVENOUS

## 2022-07-23 MED ORDER — NEOMYCIN-POLYMYXIN-DEXAMETH 0.1 % OP OINT: 1.0000 | TOPICAL_OINTMENT | Freq: Four times a day (QID) | OPHTHALMIC | 0 refills | Status: DC

## 2022-07-23 MED ORDER — OXYCODONE HCL 5 MG PO TABS: 5.0000 mg | ORAL_TABLET | Freq: Once | ORAL | Status: DC | PRN

## 2022-07-23 MED ORDER — NEOMYCIN-POLYMYXIN-DEXAMETH 3.5-10000-0.1 OP OINT
TOPICAL_OINTMENT | OPHTHALMIC | Status: DC | PRN
  Administered 2022-07-23: 1 via OPHTHALMIC

## 2022-07-23 MED ORDER — BSS IO SOLN
INTRAOCULAR | Status: DC | PRN
  Administered 2022-07-23: 15 mL

## 2022-07-23 MED ORDER — ACETAMINOPHEN 10 MG/ML IV SOLN: 1000.0000 mg | Freq: Once | INTRAVENOUS | Status: DC | PRN

## 2022-07-23 MED ORDER — PROPOFOL 10 MG/ML IV BOLUS
INTRAVENOUS | Status: DC | PRN
  Administered 2022-07-23: 150 mg via INTRAVENOUS

## 2022-07-23 SURGICAL SUPPLY — 30 items
APL SRG 3 HI ABS STRL LF PLS (MISCELLANEOUS) ×1
APL SWBSTK 6 STRL LF DISP (MISCELLANEOUS) ×4
APPLICATOR COTTON TIP 6 STRL (MISCELLANEOUS) ×4 IMPLANT
APPLICATOR COTTON TIP 6IN STRL (MISCELLANEOUS) ×8
APPLICATOR DR MATTHEWS STRL (MISCELLANEOUS) ×2 IMPLANT
BNDG EYE OVAL (GAUZE/BANDAGES/DRESSINGS) IMPLANT
CAUTERY EYE LOW TEMP 1300F FIN (OPHTHALMIC RELATED) IMPLANT
CORD BIPOLAR FORCEPS 12FT (ELECTRODE) ×2 IMPLANT
COVER BACK TABLE 60X90IN (DRAPES) ×2 IMPLANT
COVER MAYO STAND STRL (DRAPES) ×2 IMPLANT
DRAPE SURG 17X23 STRL (DRAPES) IMPLANT
DRAPE U-SHAPE 76X120 STRL (DRAPES) ×2 IMPLANT
GLOVE BIO SURGEON STRL SZ 6.5 (GLOVE) ×2 IMPLANT
GLOVE BIO SURGEON STRL SZ7 (GLOVE) ×2 IMPLANT
GOWN STRL REUS W/ TWL LRG LVL3 (GOWN DISPOSABLE) ×2 IMPLANT
GOWN STRL REUS W/TWL LRG LVL3 (GOWN DISPOSABLE) ×4
NS IRRIG 1000ML POUR BTL (IV SOLUTION) ×2 IMPLANT
PACK BASIN DAY SURGERY FS (CUSTOM PROCEDURE TRAY) ×2 IMPLANT
SHEILD EYE MED CORNL SHD 22X21 (OPHTHALMIC RELATED)
SHIELD EYE MED CORNL SHD 22X21 (OPHTHALMIC RELATED) IMPLANT
SLEEVE SCD COMPRESS KNEE MED (STOCKING) ×1 IMPLANT
SPEAR EYE SURG WECK-CEL (MISCELLANEOUS) ×2 IMPLANT
STRIP CLOSURE SKIN 1/4X4 (GAUZE/BANDAGES/DRESSINGS) IMPLANT
SUT CHROMIC 7 0 TG140 8 (SUTURE) ×2 IMPLANT
SUT VICRYL 6 0 S 28 (SUTURE) IMPLANT
SUT VICRYL ABS 6-0 S29 18IN (SUTURE) IMPLANT
SYR 10ML LL (SYRINGE) ×2 IMPLANT
SYR 3ML 23GX1 SAFETY (SYRINGE) ×4 IMPLANT
TOWEL GREEN STERILE FF (TOWEL DISPOSABLE) ×2 IMPLANT
TRAY DSU PREP LF (CUSTOM PROCEDURE TRAY) ×2 IMPLANT

## 2022-07-23 NOTE — Anesthesia Postprocedure Evaluation (Signed)
Anesthesia Post Note  Patient: Edward Medina  Procedure(s) Performed: REPAIR STRABISMUS BILATERAL (Bilateral: Eye)     Patient location during evaluation: PACU Anesthesia Type: General Level of consciousness: awake and alert Pain management: pain level controlled Vital Signs Assessment: post-procedure vital signs reviewed and stable Respiratory status: spontaneous breathing, nonlabored ventilation, respiratory function stable and patient connected to nasal cannula oxygen Cardiovascular status: blood pressure returned to baseline and stable Postop Assessment: no apparent nausea or vomiting Anesthetic complications: no   No notable events documented.  Last Vitals:  Vitals:   07/23/22 1031 07/23/22 1057  BP: 113/78 (!) 114/95  Pulse: (!) 57 75  Resp: 12 20  Temp:  (!) 36.1 C  SpO2: 95% 96%    Last Pain:  Vitals:   07/23/22 1057  TempSrc: Oral  PainSc: 0-No pain                 Effie Berkshire

## 2022-07-23 NOTE — Interval H&P Note (Signed)
History and Physical Interval Note:  07/23/2022 8:30 AM  [name]  has presented today for surgery, with the diagnosis of EXOTROPIA.  The various methods of treatment have been discussed with the patient and family. After consideration of risks, benefits and other options for treatment, the patient has consented to  Procedure(s): REPAIR STRABISMUS BILATERAL (Bilateral) as a surgical intervention.  The patient's history has been reviewed, patient examined, no change in status, stable for surgery.  I have reviewed the patient's chart and labs.  Questions were answered to the patient's satisfaction.     Lamonte Sakai

## 2022-07-23 NOTE — Discharge Instructions (Addendum)
Diet: Clear liquids, advance to soft foods then regular diet as tolerated.  Pain control: Overlapping ibuprofen (aka Advil, Motrin) with acetaminophen (aka Tylenol, Excedrin) has been clinically proven as effective for pain as morphine.  1)  Ibuprofen 600 mg by mouth every 6 hours as needed for pain   Do not take this medication if you already take NSAIDs (such as naproxen/Aleve or Nature conservation officer).  2)  Acetaminophen 325 one or two by mouth every 4-6 hours as needed for pain that is not resolved by ibuprofen   Do not take this medication if you already take acetaminophen within another medication (such as Percocet/Lortab).  Eye medications:  Antibiotic eye drops or ointment, one drop or application in the operated eye(s) 4 times a day for 10 days.    Activity: No swimming for 1 week. It is OK to let water run over the face and eyes while showering or taking a bath, even during the first week.  No other restriction on exercise or activity.  Eye movement: The eyes may look very slightly crossed in or turned out, and you may experience temporary double vision (diplopia). This is not unusual postoperatively and may happen up to two months after surgery while the muscles are healing. The eyes may be tired during the first few weeks after surgery; reading can be uncomfortable during the healing process but will not hurt the eyes.  Call Dr. Serita Grit office [phone] with any problems or concerns.    Post Anesthesia Home Care Instructions  Activity: Get plenty of rest for the remainder of the day. A responsible individual must stay with you for 24 hours following the procedure.  For the next 24 hours, DO NOT: -Drive a car -Paediatric nurse -Drink alcoholic beverages -Take any medication unless instructed by your physician -Make any legal decisions or sign important papers.  Meals: Start with liquid foods such as gelatin or soup. Progress to regular foods as tolerated. Avoid greasy,  spicy, heavy foods. If nausea and/or vomiting occur, drink only clear liquids until the nausea and/or vomiting subsides. Call your physician if vomiting continues.  Special Instructions/Symptoms: Your throat may feel dry or sore from the anesthesia or the breathing tube placed in your throat during surgery. If this causes discomfort, gargle with warm salt water. The discomfort should disappear within 24 hours.  If you had a scopolamine patch placed behind your ear for the management of post- operative nausea and/or vomiting:  1. The medication in the patch is effective for 72 hours, after which it should be removed.  Wrap patch in a tissue and discard in the trash. Wash hands thoroughly with soap and water. 2. You may remove the patch earlier than 72 hours if you experience unpleasant side effects which may include dry mouth, dizziness or visual disturbances. 3. Avoid touching the patch. Wash your hands with soap and water after contact with the patch.

## 2022-07-23 NOTE — Transfer of Care (Signed)
Immediate Anesthesia Transfer of Care Note  Patient: Edward Medina  Procedure(s) Performed: REPAIR STRABISMUS BILATERAL (Bilateral: Eye)  Patient Location: PACU  Anesthesia Type:General  Level of Consciousness: sedated  Airway & Oxygen Therapy: Patient Spontanous Breathing and Patient connected to face mask oxygen  Post-op Assessment: Report given to RN and Post -op Vital signs reviewed and stable  Post vital signs: Reviewed and stable  Last Vitals:  Vitals Value Taken Time  BP    Temp    Pulse    Resp    SpO2      Last Pain:  Vitals:   07/23/22 0801  TempSrc: Oral  PainSc: 0-No pain         Complications: No notable events documented.

## 2022-07-23 NOTE — Op Note (Signed)
07/23/2022  10:07 AM  PATIENT:  [name]  66 y.o. male  PRE-OPERATIVE DIAGNOSIS:  Longstanding left exotropia; mild amblyopia left eye  POST-OPERATIVE DIAGNOSIS:  Longstanding left exotropia; mild amblyopia left eye  PROCEDURE:  Lateral rectus muscle recession 54m both  SURGEON:  MAnnita Brod M.D.   ANESTHESIA: General LMA and local subTenons bupivicaine  COMPLICATIONS: None immediate  DESCRIPTION OF PROCEDURE: The patient was taken to the operating room where He was identified by me. General anesthesia was induced without difficulty after placement of appropriate monitors. The patient was prepped and draped in the usual sterile ophthalmic fashion. Maxitrol eye ointment was placed in both eyes for corneal protection during the case.  A lid speculum was placed in the right eye. Forced ductions were unremarkable. Through an inferotemporal fornix incision through conjunctiva and Tenon's fascia, the right lateral rectus muscle was engaged on a series of muscle hooks and cleared of its fascial attachments. The tendon was secured with a double-armed 6-0 Vicryl suture with a double locking bite at each border of the muscle, 1 mm from the insertion. The muscle was disinserted, and was reattached to sclera at a measured distance of 9 millimeters posterior to the original insertion, using direct scleral passes in crossed swords fashion.  The suture ends were tied securely after the position of the muscle had been checked and found to be accurate. 1.5100mof bupivacaine 0.5% was diffused into the sub-Tenons space for perioperative anesthesia. Conjunctiva was closed with 2 7-0 Chromic sutures.  The speculum was transferred to the left eye, where an identical procedure was performed, again effecting a 9 millimeters recession of the lateral rectus muscle.  Two drops of dilute betadine were placed on each eye and rinsed after ten seconds; Maxitrol ointment was placed in each eye. The patient was  awakened without difficulty and taken to the recovery room in stable condition, having suffered no intraoperative or immediate postoperative complications.  M.Maryelizabeth Kaufmann.

## 2022-07-23 NOTE — Anesthesia Procedure Notes (Signed)
Procedure Name: LMA Insertion Date/Time: 07/23/2022 9:05 AM  Performed by: Willa Frater, CRNAPre-anesthesia Checklist: Patient identified, Emergency Drugs available, Suction available and Patient being monitored Patient Re-evaluated:Patient Re-evaluated prior to induction Oxygen Delivery Method: Circle System Utilized Preoxygenation: Pre-oxygenation with 100% oxygen Induction Type: IV induction Ventilation: Mask ventilation without difficulty LMA: LMA flexible inserted LMA Size: 4.0 Number of attempts: 1 Airway Equipment and Method: bite block Placement Confirmation: positive ETCO2 Tube secured with: Tape Dental Injury: Teeth and Oropharynx as per pre-operative assessment

## 2022-07-23 NOTE — Anesthesia Preprocedure Evaluation (Addendum)
Anesthesia Evaluation  Patient identified by MRN, date of birth, ID band Patient awake    Reviewed: Allergy & Precautions, NPO status , Patient's Chart, lab work & pertinent test results  Airway Mallampati: I  TM Distance: >3 FB Neck ROM: Full    Dental  (+) Teeth Intact, Dental Advisory Given   Pulmonary    breath sounds clear to auscultation       Cardiovascular  Rhythm:Regular Rate:Normal     Neuro/Psych  Headaches, negative psych ROS   GI/Hepatic negative GI ROS, Neg liver ROS,   Endo/Other  negative endocrine ROS  Renal/GU negative Renal ROS     Musculoskeletal negative musculoskeletal ROS (+)   Abdominal Normal abdominal exam  (+)   Peds  Hematology negative hematology ROS (+)   Anesthesia Other Findings   Reproductive/Obstetrics                            Anesthesia Physical Anesthesia Plan  ASA: 2  Anesthesia Plan: General   Post-op Pain Management:    Induction: Intravenous  PONV Risk Score and Plan: 3 and Ondansetron, Dexamethasone and Midazolam  Airway Management Planned: LMA  Additional Equipment: None  Intra-op Plan:   Post-operative Plan: Extubation in OR  Informed Consent: I have reviewed the patients History and Physical, chart, labs and discussed the procedure including the risks, benefits and alternatives for the proposed anesthesia with the patient or authorized representative who has indicated his/her understanding and acceptance.     Dental advisory given  Plan Discussed with: CRNA  Anesthesia Plan Comments:        Anesthesia Quick Evaluation

## 2022-07-26 ENCOUNTER — Encounter (HOSPITAL_BASED_OUTPATIENT_CLINIC_OR_DEPARTMENT_OTHER): Payer: Self-pay | Admitting: Ophthalmology

## 2022-08-27 DIAGNOSIS — Z006 Encounter for examination for normal comparison and control in clinical research program: Secondary | ICD-10-CM

## 2022-08-27 MED ORDER — STUDY - VESALIUS - EVOLOCUMAB (AMG 145) 140 MG/ML OR PLACEBO SQ INJECTION (PI-HILTY): 140.0000 mg | INJECTION | SUBCUTANEOUS | 0 refills | Status: DC

## 2022-08-27 MED ORDER — STUDY - VESALIUS - EVOLOCUMAB (AMG 145) 140 MG/ML OR PLACEBO SQ INJECTION (PI-HILTY)
140.0000 mg | INJECTION | SUBCUTANEOUS | Status: DC
  Administered 2022-08-27: 140 mg via SUBCUTANEOUS
  Filled 2022-08-27: qty 1

## 2022-08-27 NOTE — Research (Signed)
Patient was seen in the research clinic today for [facility] week 128 visit. Reviewed all concomitant medications and no changes noted at this time. Patient did state that he had an elective outpatient surgery on 07/23/2022 for repair of strabismus bilaterally. His eyes have now healed since the surgery. Denies any adverse events or hospitalizations since last visit. Patient stated that in October he will be going to [place] for 3 weeks and asked how to travel with the medication so that it would be okay to give and not miss any injections. I asked pharmacy and they will look up how long it can be at room temperature without disrupting the medication. Also instructed the patient to contact the airlines that he is traveling with. Pharmacy did recommend not to put the IP on ice and to take the freezer packs and refrigerate them to use. Next appt will be in December.  [facility] Treatment Compliance Date returned: 08/27/2022 Box Numbers:2 Number of syringes returned:7 Number of Full administrations:7  Number of partial administrations:0 Number of non-administrations:0 Number on unknown administrations:0  Reason for any partial or non-administrations:n/a Number due to non-compliance: Number due to device complaint: Number to do other:  IP Dispensation: Date dispensed: 08/27/2022 Number dispensed total:8 Number dispensed home to patient:7 Box #:[id number] Box#: [id number]  IP Administration in Clinic: Box #:[id number] Administration time: 0915 Amount administered (none, partial, full):full Reason for none or partial dose: Administration Site: RLQ Administered by: study subject   Current Outpatient Medications:    aluminum chloride (DRYSOL) 20 % external solution, Apply topically at bedtime., Disp: , Rfl:    aspirin 81 MG tablet, Take 81 mg by mouth daily., Disp: , Rfl:    Cholecalciferol (VITAMIN D3) 50 MCG (2000 UT) TABS, Take 1 tablet by mouth daily. , Disp: , Rfl:    COLLAGEN PO, Adds as a  daily supplement to coffee, Disp: , Rfl:    finasteride (PROSCAR) 5 MG tablet, Take 5 mg by mouth daily., Disp: , Rfl:    fish oil-omega-3 fatty acids 1000 MG capsule, Take 2 g by mouth daily. , Disp: , Rfl:    Multiple Vitamin (MULTI VITAMIN MENS PO), Take by mouth daily., Disp: , Rfl:    Saw Palmetto, Serenoa repens, (SAW PALMETTO PO), Take 1 tablet by mouth daily. , Disp: , Rfl:    Study - V[facility] - evolocumab (AMG 145) 140 mg/mL or placebo SQ injection (PI-Hilty), Inject 1 mL (140 mg total) into the skin every 14 (fourteen) days. For Investigational Use Only. Inject 1 mL (contents of one pen) subcutaneously every 2 weeks. Return kits at next visit., Disp: 7 mL, Rfl: 0   terazosin (HYTRIN) 10 MG capsule, Take 5 mg by mouth daily., Disp: , Rfl:    neomycin-polymyxin-dexameth (MAXITROL) 0.1 % OINT, Place 1 Application into both eyes 4 (four) times daily. For 1 week (Patient not taking: Reported on 08/27/2022), Disp: 1 g, Rfl: 0   nitroGLYCERIN (NITROSTAT) 0.4 MG SL tablet, Place 1 tablet (0.4 mg total) under the tongue every 5 (five) minutes as needed for chest pain., Disp: 25 tablet, Rfl: 3  Current Facility-Administered Medications:    Study - VESALIUS - evolocumab (AMG 145) 140 mg/mL or placebo SQ injection (PI-Hilty), 140 mg, Subcutaneous, Q14 Days, Pixie Casino, MD, 140 mg at 08/27/22 [phone]

## 2022-09-13 ENCOUNTER — Encounter: Payer: Self-pay | Admitting: *Deleted

## 2022-10-29 ENCOUNTER — Encounter: Payer: 59 | Admitting: Family Medicine

## 2022-11-13 IMAGING — MR MR HEAD WO/W CM
13 of 14 series · 44 of 48 positions shown · IV contrast (Multihance 20cc)
Comparison: 12/06/2021

CLINICAL DATA: Follow-up vestibular schwannoma

EXAM:
MRI HEAD WITHOUT AND WITH CONTRAST
TECHNIQUE: Multiplanar, multiecho pulse sequences of the brain and surrounding
structures were obtained without and with intravenous contrast.
CONTRAST:  17mL MULTIHANCE GADOBENATE DIMEGLUMINE 529 MG/ML IV SOLN

[Series 5: T1 · sagittal · 4.0mm · 0.72mm/px · 1 of 26 slices shown (1 of 3)]
[im 1/26]
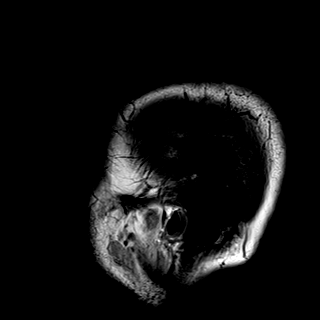

[Series 6: DWI · axial · 3.0mm · 0.98mm/px · z∈[-58,+82]mm · 10 of 160 slices shown]
[im 1/160]
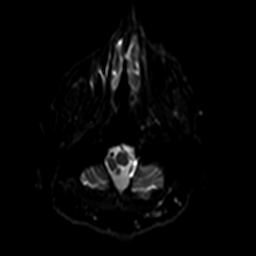
[im 18/160]
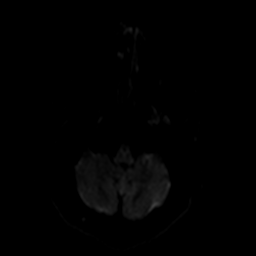
[im 36/160]
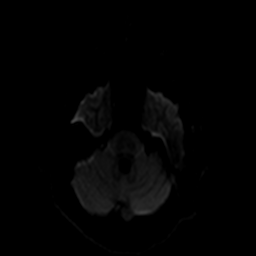
[im 54/160]
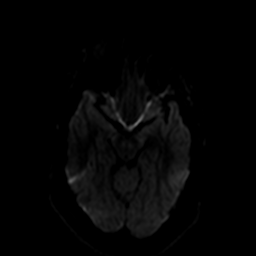
[im 71/160]
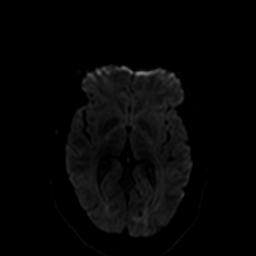
[im 89/160]
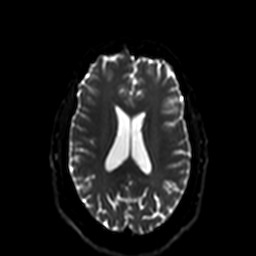
[im 107/160]
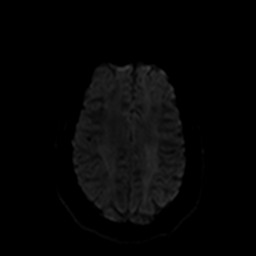
[im 124/160]
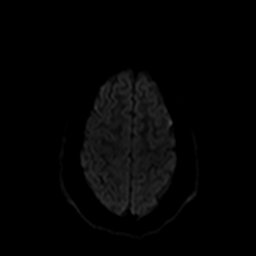
[im 142/160]
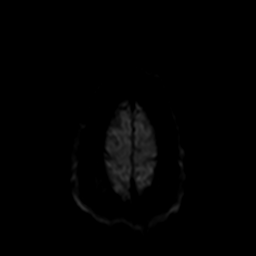
[im 160/160]
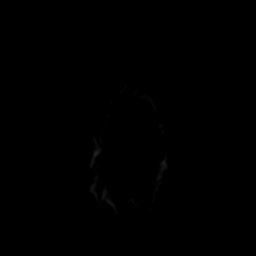

[Series 7: ax dwi_tracew · axial · 3.0mm · 0.98mm/px · z∈[-58,+82]mm · 5 of 80 slices shown]
[im 1/80]
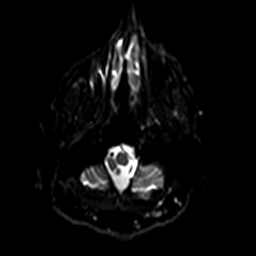
[im 20/80]
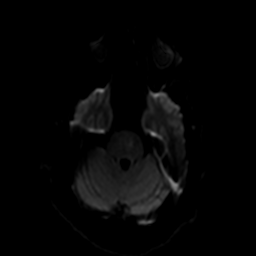
[im 40/80]
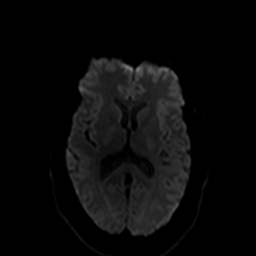
[im 60/80]
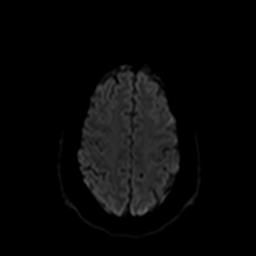
[im 80/80]
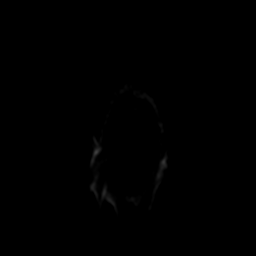

[Series 8: ax dwi_adc · axial · 3.0mm · 0.98mm/px · z∈[-58,+82]mm · 3 of 40 slices shown]
[im 1/40]
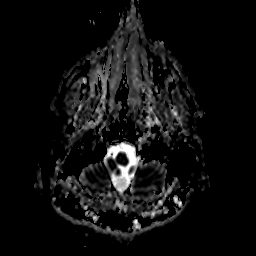
[im 20/40]
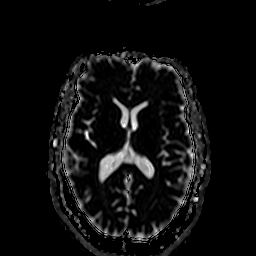
[im 40/40]
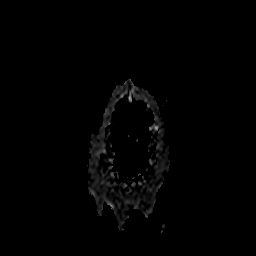

[Series 9: T2 · axial · 4.0mm · 0.39mm/px · z∈[-65,+86]mm · 2 of 30 slices shown (1 of 2)]
[im 1/30]
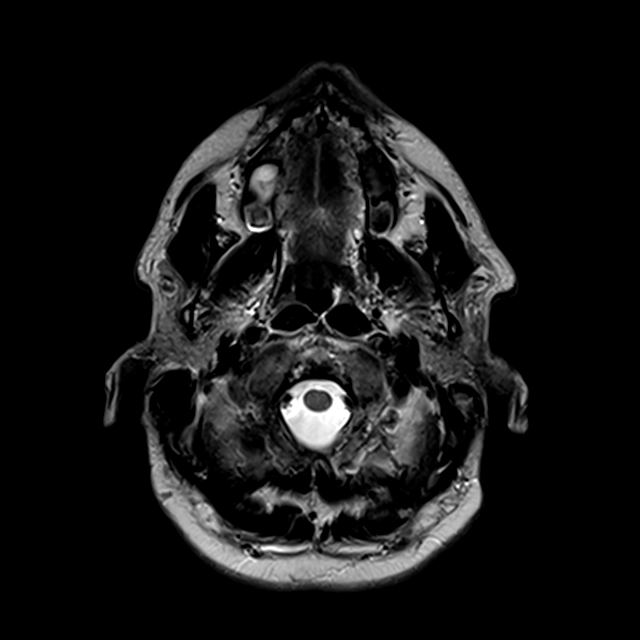
[im 30/30]
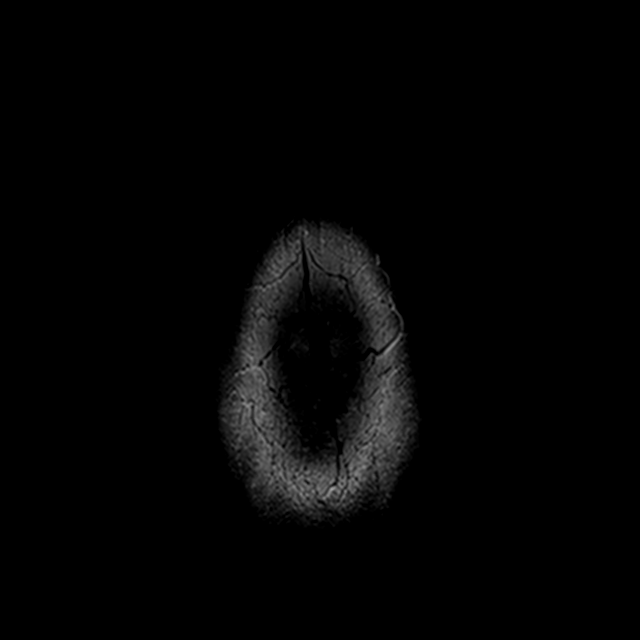

[Series 10: FLAIR · axial · 3.0mm · 0.75mm/px · z∈[-70,+92]mm · 2 of 28 slices shown]
[im 1/28]
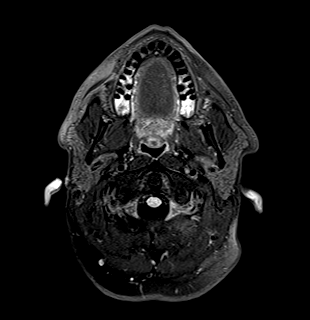
[im 28/28]
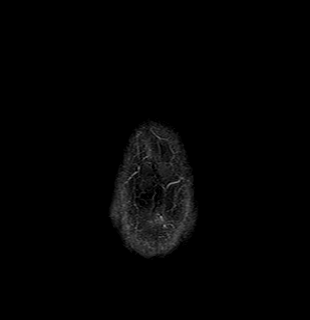

[Series 12: swi_images · axial · 1.5mm · 0.94mm/px · z∈[-60,+82]mm · 6 of 96 slices shown]
[im 1/96]
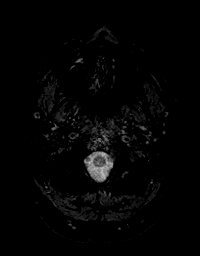
[im 20/96]
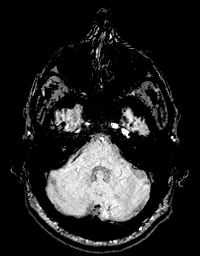
[im 39/96]
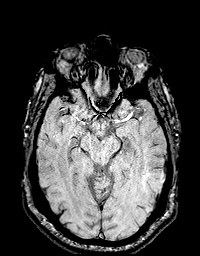
[im 58/96]
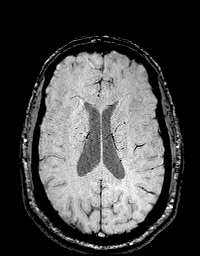
[im 77/96]
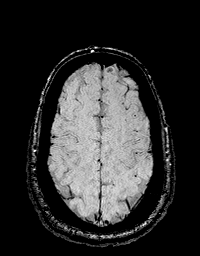
[im 96/96]
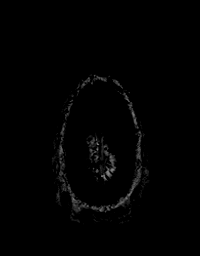

[Series 13: T1 · coronal · 3.0mm · 0.56mm/px · 1 of 13 slices shown (2 of 3)]
[im 1/13]
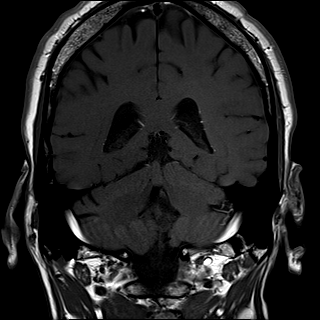

[Series 14: T2 · coronal · 2.0mm · 0.56mm/px · 1 of 13 slices shown (2 of 2)]
[im 1/13]
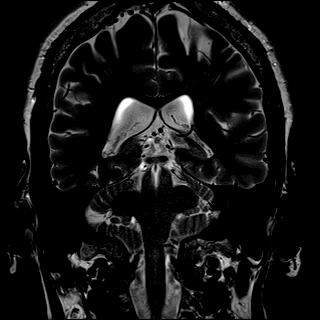

[Series 15: T1 · axial · 3.0mm · 0.50mm/px · 1 of 13 slices shown (3 of 3)]
[im 1/13]
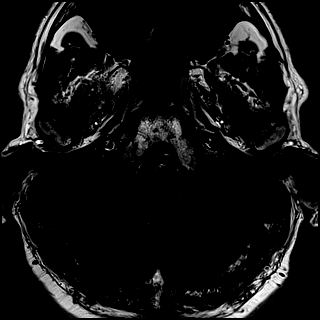

[Series 17: T1 post-contrast · coronal · 3.0mm · 0.56mm/px · 1 of 13 slices shown (1 of 3)]
[im 1/13]
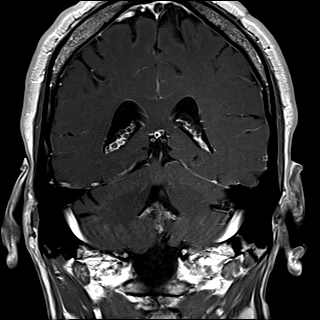

[Series 18: T1 post-contrast · axial · 3.0mm · 0.50mm/px · 1 of 13 slices shown (2 of 3)]
[im 1/13]
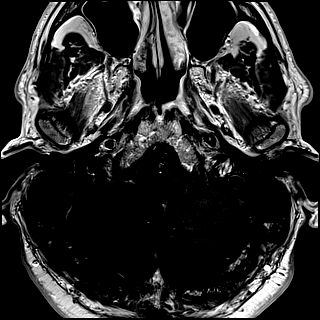

[Series 19: T1 post-contrast · axial · 1.0mm · 0.90mm/px · z∈[-67,+92]mm · 10 of 160 slices shown (3 of 3)]
[im 1/160]
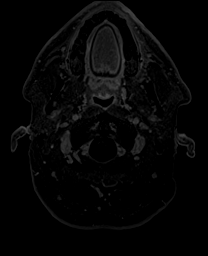
[im 18/160]
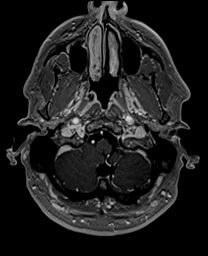
[im 36/160]
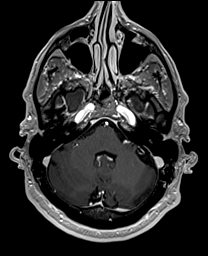
[im 54/160]
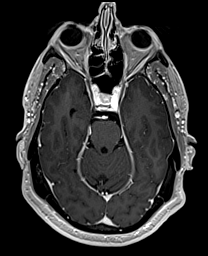
[im 71/160]
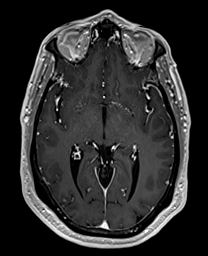
[im 89/160]
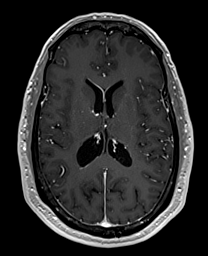
[im 107/160]
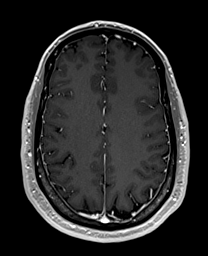
[im 124/160]
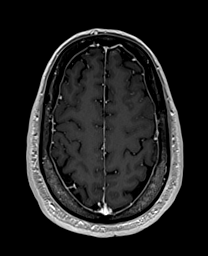
[im 142/160]
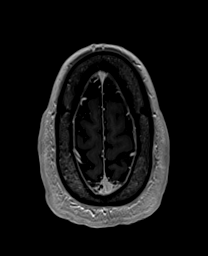
[im 160/160]
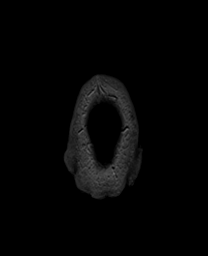

[44 of 48 positions shown; findings below may reference images not displayed]

FINDINGS: Brain: No restricted diffusion to suggest acute or subacute infarct.
No acute hemorrhage, mass, mass effect, or midline shift. No
hydrocephalus or extra-axial collection. No abnormal parenchymal or
meningeal enhancement.

Cranial Nerves VII and VIII: Redemonstrated homogeneously enhancing
mass extending from the right cerebellopontine angle into the right
IAC, measuring 6 x 17 x 6 mm (AP x TR x CC) (series 18, image 6 and
series 17, image 7), unchanged from the prior exam when measured
similarly. The mass does not appear to extend through the cochlear
aperture. The left CN VII and VIII are normal, without evidence of
mass or abnormal enhancement.

Cochleae: Normal bilaterally.

Semicircular Canals: Normal bilaterally.

Porus Acusticus: Normal on the left

Cerebellopontine Angle: Normal on the left, without evidence of
mass.

Vascular: Normal arterial flow voids. Venous sinuses are patent on
postcontrast imaging.

Skull and upper cervical spine: Normal marrow signal.

Sinuses/Orbits: Mucosal thickening in the ethmoid air cells and
right maxillary sinus. The orbits are unremarkable.

Other: Trace fluid in the mastoid air cells.
IMPRESSION: 1. Unchanged appearance of a 17 mm right cerebellopontine angle mass
extending into the right internal auditory canal to the level of the
fundus, favored to represent a vestibular schwannoma.

2.  No acute intracranial process.

## 2022-11-17 ENCOUNTER — Ambulatory Visit (INDEPENDENT_AMBULATORY_CARE_PROVIDER_SITE_OTHER): Payer: 59 | Admitting: Family Medicine

## 2022-11-17 ENCOUNTER — Encounter: Payer: Self-pay | Admitting: Family Medicine

## 2022-11-17 VITALS — BP 114/78 | HR 69 | Temp 97.8°F | Ht 69.0 in | Wt 173.2 lb

## 2022-11-17 DIAGNOSIS — E785 Hyperlipidemia, unspecified: Secondary | ICD-10-CM | POA: Diagnosis not present

## 2022-11-17 DIAGNOSIS — R739 Hyperglycemia, unspecified: Secondary | ICD-10-CM | POA: Diagnosis not present

## 2022-11-17 DIAGNOSIS — N401 Enlarged prostate with lower urinary tract symptoms: Secondary | ICD-10-CM

## 2022-11-17 DIAGNOSIS — G4762 Sleep related leg cramps: Secondary | ICD-10-CM

## 2022-11-17 DIAGNOSIS — D333 Benign neoplasm of cranial nerves: Secondary | ICD-10-CM | POA: Insufficient documentation

## 2022-11-17 DIAGNOSIS — Z0001 Encounter for general adult medical examination with abnormal findings: Secondary | ICD-10-CM | POA: Diagnosis not present

## 2022-11-17 DIAGNOSIS — R351 Nocturia: Secondary | ICD-10-CM

## 2022-11-17 DIAGNOSIS — I2510 Atherosclerotic heart disease of native coronary artery without angina pectoris: Secondary | ICD-10-CM

## 2022-11-17 DIAGNOSIS — I2583 Coronary atherosclerosis due to lipid rich plaque: Secondary | ICD-10-CM

## 2022-11-17 LAB — COMPREHENSIVE METABOLIC PANEL
ALT: 51 U/L (ref 0–53)
AST: 29 U/L (ref 0–37)
Albumin: 4.3 g/dL (ref 3.5–5.2)
Alkaline Phosphatase: 96 U/L (ref 39–117)
BUN: 19 mg/dL (ref 6–23)
CO2: 30 mEq/L (ref 19–32)
Calcium: 9.5 mg/dL (ref 8.4–10.5)
Chloride: 102 mEq/L (ref 96–112)
Creatinine, Ser: 1.05 mg/dL (ref 0.40–1.50)
GFR: 75.32 mL/min (ref >60.00)
Glucose, Bld: 83 mg/dL (ref 70–99)
Potassium: 4.5 mEq/L (ref 3.5–5.1)
Sodium: 139 mEq/L (ref 135–145)
Total Bilirubin: 1 mg/dL (ref 0.2–1.2)
Total Protein: 6.9 g/dL (ref 6.0–8.3)

## 2022-11-17 LAB — LIPID PANEL
Cholesterol: 148 mg/dL (ref 0–200)
HDL: 51.8 mg/dL (ref >39.00)
LDL Cholesterol: 79 mg/dL (ref 0–99)
NonHDL: 95.83
Total CHOL/HDL Ratio: 3
Triglycerides: 85 mg/dL (ref 0.0–149.0)
VLDL: 17 mg/dL (ref 0.0–40.0)

## 2022-11-17 LAB — MAGNESIUM: Magnesium: 1.9 mg/dL (ref 1.5–2.5)

## 2022-11-17 LAB — TSH: TSH: 0.95 u[IU]/mL (ref 0.35–5.50)

## 2022-11-17 LAB — HEMOGLOBIN A1C: Hgb A1c MFr Bld: 5.9 % (ref 4.6–6.5)

## 2022-11-17 NOTE — Assessment & Plan Note (Signed)
A1c 5.9 last year.  We will recheck today.  Discussed lifestyle modifications.

## 2022-11-17 NOTE — Assessment & Plan Note (Signed)
Overall stable.  Follows with urology.  He is weaning down on terazosin and is now on 5 mg daily.  Also on finasteride 5 mg daily.

## 2022-11-17 NOTE — Assessment & Plan Note (Signed)
Currently following with ENT for this however would like to see a neurosurgeon to discuss management options.  We will place referral today.

## 2022-11-17 NOTE — Assessment & Plan Note (Signed)
Follows with cardiology.  On aspirin and is enrolled in repatha trial.

## 2022-11-17 NOTE — Patient Instructions (Signed)
It was very nice to see you today!  I will refer you to see a neurosurgeon at [facility] to discuss your acoustic neuroma.  We will check blood work today.  Please make sure that you are balancing your fluids.  You can also try stretching at night to see if this prevents the cramps.  Please keep up the good work with your diet and exercise.  Please come back to see me in 1 year for your next physical.  Come back sooner if needed.  Take care, Dr [name]  PLEASE NOTE:  If you had any lab tests please let us know if you have not heard back within a few days. You may see your results on mychart before we have a chance to review them but we will give you a call once they are reviewed by [place]. If we ordered any referrals today, please let us know if you have not heard from their office within the next week.   Please try these tips to maintain a healthy lifestyle:  Eat at least 3 REAL meals and 1-2 snacks per day.  Aim for no more than 5 hours between eating.  If you eat breakfast, please do so within one hour of getting up.   Each meal should contain half fruits/vegetables, one quarter protein, and one quarter carbs (no bigger than a computer mouse)  Cut down on sweet beverages. This includes juice, soda, and sweet tea.   Drink at least 1 glass of water with each meal and aim for at least 8 glasses per day  Exercise at least 150 minutes every week.    Preventive Care 27-84 Years Old, Male Preventive care refers to lifestyle choices and visits with your health care provider that can promote health and wellness. Preventive care visits are also called wellness exams. What can I expect for my preventive care visit? Counseling During your preventive care visit, your health care provider may ask about your: Medical history, including: Past medical problems. Family medical history. Current health, including: Emotional well-being. Home life and relationship well-being. Sexual activity. Lifestyle,  including: Alcohol, nicotine or tobacco, and drug use. Access to firearms. Diet, exercise, and sleep habits. Safety issues such as seatbelt and bike helmet use. Sunscreen use. Work and work Statistician. Physical exam Your health care provider will check your: Height and weight. These may be used to calculate your BMI (body mass index). BMI is a measurement that tells if you are at a healthy weight. Waist circumference. This measures the distance around your waistline. This measurement also tells if you are at a healthy weight and may help predict your risk of certain diseases, such as type 2 diabetes and high blood pressure. Heart rate and blood pressure. Body temperature. Skin for abnormal spots. What immunizations do I need?  Vaccines are usually given at various ages, according to a schedule. Your health care provider will recommend vaccines for you based on your age, medical history, and lifestyle or other factors, such as travel or where you work. What tests do I need? Screening Your health care provider may recommend screening tests for certain conditions. This may include: Lipid and cholesterol levels. Diabetes screening. This is done by checking your blood sugar (glucose) after you have not eaten for a while (fasting). Hepatitis B test. Hepatitis C test. HIV (human immunodeficiency virus) test. STI (sexually transmitted infection) testing, if you are at risk. Lung cancer screening. Prostate cancer screening. Colorectal cancer screening. Talk with your health care provider about  your test results, treatment options, and if necessary, the need for more tests. Follow these instructions at home: Eating and drinking  Eat a diet that includes fresh fruits and vegetables, whole grains, lean protein, and low-fat dairy products. Take vitamin and mineral supplements as recommended by your health care provider. Do not drink alcohol if your health care provider tells you not to  drink. If you drink alcohol: Limit how much you have to 0-2 drinks a day. Know how much alcohol is in your drink. In the [place] one drink equals one 12 oz bottle of beer (355 mL), one 5 oz glass of wine (148 mL), or one 1 oz glass of hard liquor (44 mL). Lifestyle Brush your teeth every morning and night with fluoride toothpaste. Floss one time each day. Exercise for at least 30 minutes 5 or more days each week. Do not use any products that contain nicotine or tobacco. These products include cigarettes, chewing tobacco, and vaping devices, such as e-cigarettes. If you need help quitting, ask your health care provider. Do not use drugs. If you are sexually active, practice safe sex. Use a condom or other form of protection to prevent STIs. Take aspirin only as told by your health care provider. Make sure that you understand how much to take and what form to take. Work with your health care provider to find out whether it is safe and beneficial for you to take aspirin daily. Find healthy ways to manage stress, such as: Meditation, yoga, or listening to music. Journaling. Talking to a trusted person. Spending time with friends and family. Minimize exposure to UV radiation to reduce your risk of skin cancer. Safety Always wear your seat belt while driving or riding in a vehicle. Do not drive: If you have been drinking alcohol. Do not ride with someone who has been drinking. When you are tired or distracted. While texting. If you have been using any mind-altering substances or drugs. Wear a helmet and other protective equipment during sports activities. If you have firearms in your house, make sure you follow all gun safety procedures. What's next? Go to your health care provider once a year for an annual wellness visit. Ask your health care provider how often you should have your eyes and teeth checked. Stay up to date on all vaccines. This information is not intended to replace advice  given to you by your health care provider. Make sure you discuss any questions you have with your health care provider. Document Revised: 06/03/2021 Document Reviewed: 06/03/2021 Elsevier Patient Education  [employer].

## 2022-11-17 NOTE — Progress Notes (Signed)
Chief Complaint:  Edward Medina is a 66 y.o. male who presents today for his annual comprehensive physical exam.    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  We will place referral today.  Benign prostatic hyperplasia Overall stable.  Follows with urology.  He is weaning down on terazosin and is now on 5 mg daily.  Also on finasteride 5 mg daily.  Hyperlipidemia LDL goal <100 Check lipids.  Has not tolerated statins in the past.  Is currently in a research study with Repatha.  Study is double blinded so he is not sure if he is getting medication or placebo.  Hyperglycemia A1c 5.9 last year.  We will recheck today.  Discussed lifestyle modifications.  Coronary artery disease due to lipid rich plaque Follows with cardiology.  On aspirin and is enrolled in repatha trial.   Preventative Healthcare: Up-to-date on vaccines and screenings.  We will check labs today.  Patient Counseling(The following topics were reviewed and/or handout was given):  -Nutrition: Stressed importance of moderation in sodium/caffeine intake, saturated fat and cholesterol, caloric balance, sufficient intake of fresh fruits, vegetables, and fiber.  -Stressed the importance of regular exercise.   -Substance Abuse: Discussed cessation/primary prevention of tobacco, alcohol, or other drug use; driving or other dangerous activities under the influence; availability of treatment for abuse.   -Injury prevention: Discussed safety belts, safety helmets, smoke detector, smoking near bedding or upholstery.   -Sexuality: Discussed sexually transmitted diseases, partner selection, use of condoms, avoidance of unintended pregnancy and contraceptive alternatives.   -Dental health: Discussed importance of regular tooth brushing, flossing, and dental visits.  -Health maintenance and  immunizations reviewed. Please refer to Health maintenance section.  Return to care in 1 year for next preventative visit.     Subjective:  HPI:  He has no acute complaints today.  Since our last visit he was diagnosed with an acoustic neuroma after getting an MRI for tinnitus.  He has been following with ENT for this.  Repeat MRI showed stable tumor without any growth.  He is still following with ENT with this.  There currently in active surveillance however did discuss potential need for intervention at some point in the future if tumor increases in size.  Unfortunately has had continued deterioration of hearing in his right ear.  Lifestyle Diet: Balanced. Plenty of fruits and vegetables.  Exercise: Roller hockey routinely and treadmill.      11/17/2022    8:23 AM  Depression screen PHQ 2/9  Decreased Interest 0  Down, Depressed, Hopeless 0  PHQ - 2 Score 0    Health Maintenance Due  Topic Date Due   COVID-19 Vaccine (5 - 2023-24 season) 11/13/2022     ROS: Per HPI, otherwise a complete review of systems was negative.   PMH:  The following were reviewed and entered/updated in epic: Past Medical History:  Diagnosis Date   Acoustic neuroma ([place])    FH: CAD (coronary artery disease) 12/24/2015   High cholesterol    Migraines    Patient Active Problem List   Diagnosis Date Noted   Acoustic neuroma ([place]) 11/17/2022   Hyperglycemia 11/17/2022   Tinnitus of right ear 10/28/2021   Statin intolerance 10/27/2020   Constipation 10/27/2020   Gynecomastia 09/23/2017   Coronary artery disease due to lipid rich plaque 12/24/2015   Hyperlipidemia LDL goal <100 01/01/2015   Osteopenia 11/23/2013  Benign prostatic hyperplasia 12/15/2012   Past Surgical History:  Procedure Laterality Date   HERNIA REPAIR  2005   STRABISMUS SURGERY Bilateral 07/23/2022   Procedure: REPAIR STRABISMUS BILATERAL;  Surgeon: Lamonte Sakai, MD;  Location: [facility];  Service:  Ophthalmology;  Laterality: Bilateral;   TONSILLECTOMY  1967   WISDOM TOOTH EXTRACTION  1976    Family History  Problem Relation Age of Onset   Heart disease Father    Hyperlipidemia Father    Heart attack Father    Stroke Maternal Grandmother    Heart disease Maternal Grandfather     Medications- reviewed and updated Current Outpatient Medications  Medication Sig Dispense Refill   aluminum chloride (DRYSOL) 20 % external solution Apply topically at bedtime.     aspirin 81 MG tablet Take 81 mg by mouth daily.     Cholecalciferol (VITAMIN D3) 50 MCG (2000 UT) TABS Take 1 tablet by mouth daily.      COLLAGEN PO Adds as a daily supplement to coffee     finasteride (PROSCAR) 5 MG tablet Take 5 mg by mouth daily.     fish oil-omega-3 fatty acids 1000 MG capsule Take 2 g by mouth daily.      Multiple Vitamin (MULTI VITAMIN MENS PO) Take by mouth daily.     Saw Palmetto, Serenoa repens, (SAW PALMETTO PO) Take 1 tablet by mouth daily.      Study - VESALIUS - evolocumab (AMG 145) 140 mg/mL or placebo SQ injection (PI-Hilty) Inject 1 mL (140 mg total) into the skin every 14 (fourteen) days. For Investigational Use Only. Inject 1 mL (contents of one pen) subcutaneously every 2 weeks. Return kits at next visit. 7 mL 0   terazosin (HYTRIN) 10 MG capsule Take 5 mg by mouth daily.     Current Facility-Administered Medications  Medication Dose Route Frequency Provider Last Rate Last Admin   Study - VESALIUS - evolocumab (AMG 145) 140 mg/mL or placebo SQ injection (PI-Hilty)  140 mg Subcutaneous Q14 Days Pixie Casino, MD   140 mg at 08/27/22 0916    Allergies-reviewed and updated Allergies  Allergen Reactions   Atorvastatin Other (See Comments)    Memory impairment   Pravastatin Other (See Comments)    Memory loss/impairment   Simvastatin Other (See Comments)    MADE HIM FEEL DISORIENTED   Amoxicillin Rash    SEVERE    Social History   Socioeconomic History   Marital status:  Married    Spouse name: Not on file   Number of children: 2   Years of education: Not on file   Highest education level: Not on file  Occupational History   Occupation: IT professional  Tobacco Use   Smoking status: Never   Smokeless tobacco: Never  Vaping Use   Vaping Use: Never used  Substance and Sexual Activity   Alcohol use: Yes    Alcohol/week: 2.0 standard drinks of alcohol    Types: 1 Glasses of wine, 1 Cans of beer per week    Comment: drink - beer, wine, and hard liquor-occassionally   Drug use: No   Sexual activity: Yes    Partners: Female    Comment: number of sex partners in the last 12 months - 1, birth control method  (pill)  Other Topics Concern   Not on file  Social History Narrative   Exercise indoor roller hockey 1-2 times per week for 2 hours   College   Married   Lead  Scientist, forensic         Social Determinants of Health   Financial Resource Strain: Not on file  Food Insecurity: Not on file  Transportation Needs: Not on file  Physical Activity: Not on file  Stress: Not on file  Social Connections: Not on file        Objective:  Physical Exam: BP 114/78   Pulse 69   Temp 97.8 F (36.6 C) (Temporal)   Ht '5\' 9"'$  (1.753 m)   Wt 173 lb 3.2 oz (78.6 kg)   SpO2 97%   BMI 25.58 kg/m   Body mass index is 25.58 kg/m. Wt Readings from Last 3 Encounters:  11/17/22 173 lb 3.2 oz (78.6 kg)  07/23/22 170 lb 13.7 oz (77.5 kg)  11/04/21 174 lb (78.9 kg)   Gen: NAD, resting comfortably HEENT: TMs normal bilaterally. OP clear. No thyromegaly noted.  CV: RRR with no murmurs appreciated Pulm: NWOB, CTAB with no crackles, wheezes, or rhonchi GI: Normal bowel sounds present. Soft, Nontender, Nondistended. MSK: no edema, cyanosis, or clubbing noted Skin: warm, dry Neuro: CN2-12 grossly intact. Strength 5/5 in upper and lower extremities. Reflexes symmetric and intact bilaterally.  Psych: Normal affect and thought content     Breslin Hemann M. [name],  MD 11/17/2022 9:09 AM

## 2022-11-17 NOTE — Assessment & Plan Note (Signed)
Check lipids.  Has not tolerated statins in the past.  Is currently in a research study with Repatha.  Study is double blinded so he is not sure if he is getting medication or placebo.

## 2022-11-18 ENCOUNTER — Encounter: Payer: Self-pay | Admitting: Internal Medicine

## 2022-11-18 ENCOUNTER — Telehealth: Payer: Self-pay

## 2022-11-18 ENCOUNTER — Other Ambulatory Visit (INDEPENDENT_AMBULATORY_CARE_PROVIDER_SITE_OTHER): Payer: 59

## 2022-11-18 ENCOUNTER — Other Ambulatory Visit: Payer: 59

## 2022-11-18 ENCOUNTER — Other Ambulatory Visit: Payer: Self-pay

## 2022-11-18 DIAGNOSIS — G4762 Sleep related leg cramps: Secondary | ICD-10-CM

## 2022-11-18 DIAGNOSIS — Z0001 Encounter for general adult medical examination with abnormal findings: Secondary | ICD-10-CM | POA: Diagnosis not present

## 2022-11-18 NOTE — Telephone Encounter (Signed)
error 

## 2022-11-18 NOTE — Telephone Encounter (Signed)
Patient is scheduled for labs today 11/18/22 at 3 pm "lav and light blue" added to appt note

## 2022-11-18 NOTE — Progress Notes (Signed)
Cardiology Office Note:    Date:  11/19/2022   ID:  Edward Medina, DOB 1956/09/20, MRN [id number]  PCP:  Edward Barrack, MD   [facility] Providers Cardiologist:  Edward Sciara, MD Referring MD: Edward Barrack, MD   Chief Complaint/Reason for Referral: Cardiology follow-up  ASSESSMENT:    1. Coronary artery disease involving native coronary artery of native heart without angina pectoris   2. Research study patient   3. Hyperlipidemia LDL goal <70     PLAN:    In order of problems listed above: 1.  Coronary artery disease: The patient is doing well.  Continue current medical therapy.  Due to convenience he would like to follow-up at Edward Medina (his wife sees Edward Medina).  We will arrange for this in 1 years time. 2.  Research patient: The patient is enrolled in a trial starting Repatha. 3.  Hyperlipidemia: The patient's LDL recently was 79.  Continue current therapy.             Dispo:  Return in about 1 year (around 11/20/2023).      Medication Adjustments/Labs and Tests Ordered: Current medicines are reviewed at length with the patient today.  Concerns regarding medicines are outlined above.  The following changes have been made:  no change   Labs/tests ordered: Orders Placed This Encounter  Procedures   EKG 12-Lead    Medication Changes: No orders of the defined types were placed in this encounter.    Current medicines are reviewed at length with the patient today.  The patient does not have concerns regarding medicines.   History of Present Illness:    FOCUSED PROBLEM LIST:   1. Coronary artery disease per coronary calcium on CT scan 2021 2. Hyperlipidemia intolerant of multiple statins; enrolled in clinical trial studying Repatha 3. Family history of Edward myocardial infarction   The patient is a 66 y.o. male with the indicated medical history here for routine cardiology follow-up.  He recently traveled to [place] in [place] and had no  difficulties there.  Unfortunately he was diagnosed with an acoustic neuroma recently and is hearing in his right ear is declining.  He has had no cardiovascular issues per se however.  He denies any exertional angina, exertional dyspnea, presyncope, syncope, palpitations, paroxysmal nocturnal dyspnea, orthopnea.  He is otherwise well and without significant complaints.          Current Medications: Current Meds  Medication Sig   aluminum chloride (DRYSOL) 20 % external solution Apply topically at bedtime.   aspirin 81 MG tablet Take 81 mg by mouth daily.   Cholecalciferol (VITAMIN D3) 50 MCG (2000 UT) TABS Take 1 tablet by mouth daily.    COLLAGEN PO Adds as a daily supplement to coffee   finasteride (PROSCAR) 5 MG tablet Take 5 mg by mouth daily.   fish oil-omega-3 fatty acids 1000 MG capsule Take 2 g by mouth daily.    Multiple Vitamin (MULTI VITAMIN MENS PO) Take by mouth daily.   Saw Palmetto, Serenoa repens, (SAW PALMETTO PO) Take 1 tablet by mouth daily.    Study - VESALIUS - evolocumab (AMG 145) 140 mg/mL or placebo SQ injection (PI-Hilty) Inject 1 mL (140 mg total) into the skin every 14 (fourteen) days. For Investigational Use Only. Inject 1 mL (contents of one pen) subcutaneously every 2 weeks. Return kits at next visit.   terazosin (HYTRIN) 10 MG capsule Take 5 mg by mouth daily.   Current Facility-Administered Medications for the 11/19/22  encounter (Office Visit) with Edward Osmond, MD  Medication   Study - VESALIUS - evolocumab (AMG 145) 140 mg/mL or placebo SQ injection (PI-Hilty)     Allergies:    Atorvastatin, Pravastatin, Simvastatin, and Amoxicillin   Social History:   Social History   Tobacco Use   Smoking status: Never   Smokeless tobacco: Never  Vaping Use   Vaping Use: Never used  Substance Use Topics   Alcohol use: Yes    Alcohol/week: 2.0 standard drinks of alcohol    Types: 1 Glasses of wine, 1 Cans of beer per week    Comment: drink - beer, wine,  and hard liquor-occassionally   Drug use: No     Family Hx: Family History  Problem Relation Age of Onset   Heart disease Father    Hyperlipidemia Father    Heart attack Father    Stroke Maternal Grandmother    Heart disease Maternal Grandfather      Review of Systems:   Please see the history of present illness.    All other systems reviewed and are negative.     EKGs/Labs/Other Test Reviewed:    EKG:  EKG performed November 2022 that I personally reviewed demonstrates sinus rhythm; EKG performed today that I personally reviewed demonstrates normal sinus rhythm.  Prior CV studies:  Calcium score CT 2021: Coronary calcium score of 198. This was 38 percentile for age and sex matched control.  Other studies Reviewed: Review of the additional studies/records demonstrates: No imaging evidence of aortic atherosclerosis.  Recent Labs: 11/17/2022: ALT 51; BUN 19; Creatinine, Ser 1.05; Magnesium 1.9; Potassium 4.5; Sodium 139; TSH 0.95   Recent Lipid Panel Lab Results  Component Value Date/Time   CHOL 148 11/17/2022 09:28 AM   TRIG 85.0 11/17/2022 09:28 AM   HDL 51.80 11/17/2022 09:28 AM   LDLCALC 79 11/17/2022 09:28 AM   LDLCALC 73 10/27/2020 10:08 AM   LDLDIRECT 140.0 09/23/2017 12:01 PM    Risk Assessment/Calculations:                Physical Exam:    VS:  BP 104/64   Pulse 67   Ht '5\' 9"'$  (1.753 m)   Wt 174 lb 6.4 oz (79.1 kg)   SpO2 98%   BMI 25.75 kg/m    Wt Readings from Last 3 Encounters:  11/19/22 174 lb 6.4 oz (79.1 kg)  11/17/22 173 lb 3.2 oz (78.6 kg)  07/23/22 170 lb 13.7 oz (77.5 kg)    GENERAL:  No apparent distress, AOx3 HEENT:  No carotid bruits, +2 carotid impulses, no scleral icterus CAR: RRR no murmurs, gallops, rubs, or thrills RES:  Clear to auscultation bilaterally ABD:  Soft, nontender, nondistended, positive bowel sounds x 4 VASC:  +2 radial pulses, +2 carotid pulses, palpable pedal pulses NEURO:  CN 2-12 grossly intact; motor  and sensory grossly intact PSYCH:  No active depression or anxiety EXT:  No edema, ecchymosis, or cyanosis  Signed, Edward Osmond, MD  11/19/2022 8:51 AM    [address], [place], [place]  [address] Phone: [phone]; Fax: [phone]   Note:  This document was prepared using Dragon voice recognition software and may include unintentional dictation errors.

## 2022-11-18 NOTE — Telephone Encounter (Signed)
LMOVM advising patient to schedule lab appt to redraw one test. Platelets clumped. When appt is scheduled please add "lav and light blue" to appt note.

## 2022-11-19 ENCOUNTER — Ambulatory Visit: Payer: 59 | Attending: Internal Medicine | Admitting: Internal Medicine

## 2022-11-19 ENCOUNTER — Encounter: Payer: Self-pay | Admitting: Internal Medicine

## 2022-11-19 VITALS — BP 104/64 | HR 67 | Ht 69.0 in | Wt 174.4 lb

## 2022-11-19 DIAGNOSIS — E785 Hyperlipidemia, unspecified: Secondary | ICD-10-CM

## 2022-11-19 DIAGNOSIS — Z006 Encounter for examination for normal comparison and control in clinical research program: Secondary | ICD-10-CM

## 2022-11-19 DIAGNOSIS — I2510 Atherosclerotic heart disease of native coronary artery without angina pectoris: Secondary | ICD-10-CM

## 2022-11-19 LAB — CBC WITH DIFFERENTIAL/PLATELET
Basophils Absolute: 0 10*3/uL (ref 0.0–0.1)
Basophils Relative: 0.8 % (ref 0.0–3.0)
Eosinophils Absolute: 0.2 10*3/uL (ref 0.0–0.7)
Eosinophils Relative: 3.8 % (ref 0.0–5.0)
HCT: 45.3 % (ref 39.0–52.0)
Hemoglobin: 15.3 g/dL (ref 13.0–17.0)
Lymphocytes Relative: 31.9 % (ref 12.0–46.0)
Lymphs Abs: 2 10*3/uL (ref 0.7–4.0)
MCHC: 33.8 g/dL (ref 30.0–36.0)
MCV: 89.9 fl (ref 78.0–100.0)
Monocytes Absolute: 0.5 10*3/uL (ref 0.1–1.0)
Monocytes Relative: 7.7 % (ref 3.0–12.0)
Neutro Abs: 3.5 10*3/uL (ref 1.4–7.7)
Neutrophils Relative %: 55.8 % (ref 43.0–77.0)
Platelets: 218 10*3/uL (ref 150.0–400.0)
RBC: 5.04 Mil/uL (ref 4.22–5.81)
RDW: 13.1 % (ref 11.5–15.5)
WBC: 5.5 10*3/uL (ref 4.0–10.5)

## 2022-11-19 NOTE — Patient Instructions (Signed)
Medication Instructions:  No changes *If you need a refill on your cardiac medications before your next appointment, please call your pharmacy*   Lab Work: none If you have labs (blood work) drawn today and your tests are completely normal, you will receive your results only by: [place] (if you have MyChart) OR A paper copy in the mail If you have any lab test that is abnormal or we need to change your treatment, we will call you to review the results.   Testing/Procedures: none   Follow-Up: At [facility], you and your health needs are our priority.  As part of our continuing mission to provide you with exceptional heart care, we have created designated Provider Care Teams.  These Care Teams include your primary Cardiologist (physician) and Advanced Practice Providers (APPs -  Physician Assistants and Nurse Practitioners) who all work together to provide you with the care you need, when you need it.  We recommend signing up for the patient portal called "MyChart".  Sign up information is provided on this After Visit Summary.  MyChart is used to connect with patients for Virtual Visits (Telemedicine).  Patients are able to view lab/test results, encounter notes, upcoming appointments, etc.  Non-urgent messages can be sent to your provider as well.   To learn more about what you can do with MyChart, go to [email or web address].    Your next appointment:   12 month(s)  The format for your next appointment:   In Person  Provider:   Skeet Latch, MD or other Provider at [place]

## 2022-11-19 NOTE — Progress Notes (Signed)
Please inform patient of the following:  Blood sugar is borderline elevated but everything else is stable.  Do not need to make any changes to his treatment plan at this time.  He should continue to work on diet and exercise and we can recheck everything in a year.

## 2022-12-17 ENCOUNTER — Encounter: Payer: 59 | Admitting: *Deleted

## 2022-12-17 DIAGNOSIS — Z006 Encounter for examination for normal comparison and control in clinical research program: Secondary | ICD-10-CM

## 2022-12-17 MED ORDER — STUDY - VESALIUS - EVOLOCUMAB (AMG 145) 140 MG/ML OR PLACEBO SQ INJECTION (PI-HILTY): 140.0000 mg | INJECTION | SUBCUTANEOUS | 0 refills | Status: DC

## 2022-12-17 MED ORDER — STUDY - VESALIUS - EVOLOCUMAB (AMG 145) 140 MG/ML OR PLACEBO SQ INJECTION (PI-HILTY)
140.0000 mg | INJECTION | SUBCUTANEOUS | Status: DC
  Administered 2022-12-17: 140 mg via SUBCUTANEOUS
  Filled 2022-12-17: qty 1

## 2022-12-17 NOTE — Research (Signed)
Week 144  Patient doing well, no complaints.  NO AE or SAEs to report.   Injection given by patient @ 940 Left upper abdomen  Medication sent home with patient  Will see patient back in April for his next visit   Non-Fatal Potential Endpoint Assessment Yes  No   Has the subject experienced/undergone any of the following since the last visit/contact?   []   [x]    Any Coronary Artery Revascularization/Cerebrovascular Revascularization/ Peripheral Artery Revascularization/Amputation Procedure   []   [x]    Myocardial Infarction []   [x]    Stroke   []   X  Provide the date for the non-fatal Potential Endpoints status:   []   [x]     Current Outpatient Medications:    aspirin 81 MG tablet, Take 81 mg by mouth daily., Disp: , Rfl:    Cholecalciferol (VITAMIN D3) 50 MCG (2000 UT) TABS, Take 1 tablet by mouth daily. , Disp: , Rfl:    COLLAGEN PO, Adds as a daily supplement to coffee, Disp: , Rfl:    finasteride (PROSCAR) 5 MG tablet, Take 5 mg by mouth daily., Disp: , Rfl:    fish oil-omega-3 fatty acids 1000 MG capsule, Take 2 g by mouth daily. , Disp: , Rfl:    Multiple Vitamin (MULTI VITAMIN MENS PO), Take by mouth daily., Disp: , Rfl:    terazosin (HYTRIN) 10 MG capsule, Take 5 mg by mouth every other day., Disp: , Rfl:    aluminum chloride (DRYSOL) 20 % external solution, Apply topically at bedtime., Disp: , Rfl:    meloxicam (MOBIC) 15 MG tablet, Take 1 tablet (15 mg total) by mouth daily. (Patient not taking: Reported on 04/08/2023), Disp: 30 tablet, Rfl: 0   Study - VESALIUS - evolocumab (AMG 145) 140 mg/mL or placebo SQ injection (PI-Hilty), Inject 1 mL (140 mg total) into the skin every 14 (fourteen) days. For Investigational Use Only. Inject 1 mL (contents of one pen) subcutaneously every 2 weeks. Return kits at next visit., Disp: 8 mL, Rfl: 0  Current Facility-Administered Medications:    Study - VESALIUS - evolocumab (AMG 145) 140 mg/mL or placebo SQ injection (PI-Hilty), 140 mg,  Subcutaneous, Q14 Days, Hilty, Lisette Abu, MD, 140 mg at 04/08/23 [phone]

## 2023-02-18 ENCOUNTER — Encounter: Payer: Self-pay | Admitting: Family Medicine

## 2023-02-18 ENCOUNTER — Ambulatory Visit (INDEPENDENT_AMBULATORY_CARE_PROVIDER_SITE_OTHER): Payer: 59 | Admitting: Family Medicine

## 2023-02-18 VITALS — BP 101/66 | HR 61 | Temp 98.0°F | Ht 69.0 in | Wt 175.6 lb

## 2023-02-18 DIAGNOSIS — D333 Benign neoplasm of cranial nerves: Secondary | ICD-10-CM

## 2023-02-18 DIAGNOSIS — E785 Hyperlipidemia, unspecified: Secondary | ICD-10-CM

## 2023-02-18 MED ORDER — MELOXICAM 15 MG PO TABS: 15.0000 mg | ORAL_TABLET | Freq: Every day | ORAL | 0 refills | Status: DC

## 2023-02-18 NOTE — Assessment & Plan Note (Signed)
Last LDL 79.  Currently in [place] trial.  Study is double blinded - not sure if he is in treatment or placebo arm.

## 2023-02-18 NOTE — Patient Instructions (Signed)
It was very nice to see you today!  You probably have inflammation in your rotator cuff.  You may have bursitis.   Please work on the exercises.  Start the meloxicam.  Use ice as needed.  Let me know if not improving in the next week or so.  Take care, Dr [name]  PLEASE NOTE:  If you had any lab tests, please let us know if you have not heard back within a few days. You may see your results on mychart before we have a chance to review them but we will give you a call once they are reviewed by [place].   If we ordered any referrals today, please let us know if you have not heard from their office within the next week.   If you had any urgent prescriptions sent in today, please check with the pharmacy within an hour of our visit to make sure the prescription was transmitted appropriately.   Please try these tips to maintain a healthy lifestyle:  Eat at least 3 REAL meals and 1-2 snacks per day.  Aim for no more than 5 hours between eating.  If you eat breakfast, please do so within one hour of getting up.   Each meal should contain half fruits/vegetables, one quarter protein, and one quarter carbs (no bigger than a computer mouse)  Cut down on sweet beverages. This includes juice, soda, and sweet tea.   Drink at least 1 glass of water with each meal and aim for at least 8 glasses per day  Exercise at least 150 minutes every week.

## 2023-02-18 NOTE — Progress Notes (Signed)
   Edward Medina is a 67 y.o. male who presents today for an office visit.  Assessment/Plan:  New/Acute Problems: Left Shoulder Pain Concern for rotator cuff impingement.  Has good strength on exam-doubt full-thickness tear.  May have some underlying bursitis as well.  We discussed home exercise program and handout was given.  Also start 10-day course of meloxicam.  We discussed referral to PT however he deferred for now.  He will let me know if not improving in the next week or so and would consider referral to PT or sports medicine at that time.  We discussed reasons to return to care.  Follow-up as needed.  Chronic Problems Addressed Today: Acoustic neuroma [facility]) We referred him to neurosurgery however he never heard back.  He now has contact information and will call to schedule appointment.  No change in symptoms since last visit.   Hyperlipidemia LDL goal <100 Last LDL 79.  Currently in [place] trial.  Study is double blinded - not sure if he is in treatment or placebo arm.      Subjective:  HPI:  Patient here with left shoulder pain. Started 2 weeks ago. Thinks he may have injuried it playing roller hockey but cannot recall any specific injuries. Worse with certain motions. Hard to put on clothes and rotator left arm.  Tried using naproxen without much benefit. No weakness or numbness.        Objective:  Physical Exam: BP 101/66   Pulse 61   Temp 98 F (36.7 C) (Temporal)   Ht '5\' 9"'$  (1.753 m)   Wt 175 lb 9.6 oz (79.7 kg)   SpO2 98%   BMI 25.93 kg/m   Gen: No acute distress, resting comfortably MSK - Left Shoulder: No deformities.  Tenderness to palpation along anterior and lateral edge of acromion.  Minimal pain with resisted supraspinatus testing, internal rotation, and external rotation.  Normal strength throughout.  Neurovascular intact distally.  Positive Neer and Hawkins test Neuro: Grossly normal, moves all extremities Psych: Normal affect and thought content       Edward Medina M. [name], MD 02/18/2023 9:14 AM

## 2023-02-18 NOTE — Assessment & Plan Note (Signed)
We referred him to neurosurgery however he never heard back.  He now has contact information and will call to schedule appointment.  No change in symptoms since last visit.

## 2023-03-21 ENCOUNTER — Encounter: Payer: Self-pay | Admitting: Family Medicine

## 2023-03-21 NOTE — Telephone Encounter (Signed)
Please see pt msg and advise 

## 2023-03-22 ENCOUNTER — Other Ambulatory Visit: Payer: Self-pay

## 2023-03-22 DIAGNOSIS — D333 Benign neoplasm of cranial nerves: Secondary | ICD-10-CM

## 2023-03-22 NOTE — Telephone Encounter (Signed)
Recommend referral to sports medicine.    Edward Medina. [name], MD 03/22/2023 9:20 AM

## 2023-03-23 NOTE — Telephone Encounter (Signed)
Please enter correct Diagnosis on Referral to Sports Med

## 2023-03-25 NOTE — Progress Notes (Unsigned)
   Rubin Payor, PhD, LAT, ATC acting as a scribe for Clementeen Graham, MD.  Subjective:    CC: Left shoulder pain  HPI: Patient is a 67 year old male presenting with left shoulder pain x ***. Pt locates pain to ***  Radiates: Aggravates: Treatments tried: Meloxicam, ice, HEP  Pertinent review of Systems: ***  Relevant historical information: ***   Objective:   There were no vitals filed for this visit. General: Well Developed, well nourished, and in no acute distress.   MSK: ***  Lab and Radiology Results No results found for this or any previous visit (from the past 72 hour(s)). No results found.    Impression and Recommendations:    Assessment and Plan: 67 y.o. male with ***.  PDMP not reviewed this encounter. No orders of the defined types were placed in this encounter.  No orders of the defined types were placed in this encounter.   Discussed warning signs or symptoms. Please see discharge instructions. Patient expresses understanding.   ***

## 2023-03-28 ENCOUNTER — Other Ambulatory Visit: Payer: Self-pay

## 2023-03-28 ENCOUNTER — Encounter: Payer: Self-pay | Admitting: Family Medicine

## 2023-03-28 ENCOUNTER — Ambulatory Visit (INDEPENDENT_AMBULATORY_CARE_PROVIDER_SITE_OTHER): Payer: 59

## 2023-03-28 ENCOUNTER — Ambulatory Visit (INDEPENDENT_AMBULATORY_CARE_PROVIDER_SITE_OTHER): Payer: 59 | Admitting: Family Medicine

## 2023-03-28 VITALS — BP 102/74 | HR 72 | Ht 69.0 in | Wt 172.8 lb

## 2023-03-28 DIAGNOSIS — G8929 Other chronic pain: Secondary | ICD-10-CM

## 2023-03-28 DIAGNOSIS — M25512 Pain in left shoulder: Secondary | ICD-10-CM

## 2023-03-28 NOTE — Patient Instructions (Signed)
Thank you for coming in today.   Please get an Xray today before you leave   I've referred you to Physical Therapy.  Let us know if you don't hear from them in one week.   Recheck in about 6 weeks.   Let me know sooner if you are not doing OK.

## 2023-03-29 NOTE — Progress Notes (Signed)
Left shoulder x-ray looks normal to radiology

## 2023-04-08 ENCOUNTER — Encounter: Payer: 59 | Admitting: *Deleted

## 2023-04-08 DIAGNOSIS — Z006 Encounter for examination for normal comparison and control in clinical research program: Secondary | ICD-10-CM

## 2023-04-08 MED ORDER — STUDY - VESALIUS - EVOLOCUMAB (AMG 145) 140 MG/ML OR PLACEBO SQ INJECTION (PI-HILTY)
140.0000 mg | INJECTION | SUBCUTANEOUS | Status: DC
  Administered 2023-04-08: 140 mg via SUBCUTANEOUS
  Filled 2023-04-08: qty 1

## 2023-04-08 NOTE — Research (Unsigned)
[facility] Informed Consent   Subject Name: Edward Medina  Subject met inclusion and exclusion criteria.  The informed consent form, study requirements and expectations were reviewed with the subject and questions and concerns were addressed prior to the signing of the consent form.  The subject verbalized understanding of the trial requirements.  The subject agreed to participate in the [facility] trial and signed the informed consent on 04/08/2023.  The informed consent was obtained prior to performance of any protocol-specific procedures for the subject.  A copy of the signed informed consent was given to the subject and a copy was placed in the subject's medical record.    Subject re-consented to  Version: [place] v9.0 date 18/oct/2023 IRB approved: 11/15/2022   [name]   Patient doing well no complaints.  No hospitalization or urgent care visits to be reported Injection given by patient at 0905 on 04/08/2023 in left lower abdomen  Will see patient back Aug 9th @ 0830   Only med change was terazosin is every other day   Current Outpatient Medications:    aspirin 81 MG tablet, Take 81 mg by mouth daily., Disp: , Rfl:    Cholecalciferol (VITAMIN D3) 50 MCG (2000 UT) TABS, Take 1 tablet by mouth daily. , Disp: , Rfl:    COLLAGEN PO, Adds as a daily supplement to coffee, Disp: , Rfl:    finasteride (PROSCAR) 5 MG tablet, Take 5 mg by mouth daily., Disp: , Rfl:    fish oil-omega-3 fatty acids 1000 MG capsule, Take 2 g by mouth daily. , Disp: , Rfl:    Multiple Vitamin (MULTI VITAMIN MENS PO), Take by mouth daily., Disp: , Rfl:    Study - VESALIUS - evolocumab (AMG 145) 140 mg/mL or placebo SQ injection (PI-Hilty), Inject 1 mL (140 mg total) into the skin every 14 (fourteen) days. For Investigational Use Only. Inject 1 mL (contents of one pen) subcutaneously every 2 weeks. Return kits at next visit., Disp: 8 mL, Rfl: 0   terazosin (HYTRIN) 10 MG capsule, Take 5 mg by mouth every other  day., Disp: , Rfl:    aluminum chloride (DRYSOL) 20 % external solution, Apply topically at bedtime., Disp: , Rfl:    meloxicam (MOBIC) 15 MG tablet, Take 1 tablet (15 mg total) by mouth daily. (Patient not taking: Reported on 04/08/2023), Disp: 30 tablet, Rfl: 0  Current Facility-Administered Medications:    Study - [facility]ESALIUS - evolocumab (AMG 145) 140 mg/mL or placebo SQ injection (PI-Hilty), 140 mg, Subcutaneous, Q14 Days, Hilty, Lisette Abu, MD, 140 mg at 04/08/23 [phone]

## 2023-04-12 ENCOUNTER — Ambulatory Visit (INDEPENDENT_AMBULATORY_CARE_PROVIDER_SITE_OTHER): Payer: 59 | Admitting: Physical Therapy

## 2023-04-12 DIAGNOSIS — M25512 Pain in left shoulder: Secondary | ICD-10-CM | POA: Diagnosis not present

## 2023-04-12 NOTE — Therapy (Signed)
OUTPATIENT PHYSICAL THERAPY UPPER EXTREMITY EVALUATION   Patient Name: Edward Medina MRN: [id number] DOB:09-17-1956, 67 y.o., male Today's Date: 04/12/2023  END OF SESSION:  PT End of Session - 04/13/23 0912     Visit Number 1    Number of Visits 16    Date for PT Re-Evaluation 06/07/23    Authorization Type Clementeen Graham    PT Start Time [phone]    PT Stop Time 0930    PT Time Calculation (min) 40 min    Activity Tolerance Patient tolerated treatment well    Behavior During Therapy [facility] for tasks assessed/performed             Past Medical History:  Diagnosis Date   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   FH: CAD (coronary artery disease) 12/24/2015   High cholesterol    Migraines    Past Surgical History:  Procedure Laterality Date   HERNIA REPAIR  2005   STRABISMUS SURGERY Bilateral 07/23/2022   Procedure: REPAIR STRABISMUS BILATERAL;  Surgeon: French Ana, MD;  Location: [facility];  Service: Ophthalmology;  Laterality: Bilateral;   TONSILLECTOMY  1967   WISDOM TOOTH EXTRACTION  1976   Patient Active Problem List   Diagnosis Date Noted   Acoustic neuroma 11/17/2022   Hyperglycemia 11/17/2022   Tinnitus of right ear 10/28/2021   Statin intolerance 10/27/2020   Constipation 10/27/2020   Gynecomastia 09/23/2017   Coronary artery disease due to lipid rich plaque 12/24/2015   Hyperlipidemia LDL goal <100 01/01/2015   Osteopenia 11/23/2013   Benign prostatic hyperplasia 12/15/2012    PCP: Jacquiline Doe  REFERRING PROVIDER: Clementeen Graham   REFERRING DIAG: L shoulder pain   THERAPY DIAG:  Acute pain of left shoulder  Rationale for Evaluation and Treatment: Rehabilitation  ONSET DATE: march 2024  SUBJECTIVE:                                                                                                                                                                                      SUBJECTIVE STATEMENT:  Pt states sudden onset of L shoulder pain. Has  not had pain previously. He does not recall incident, but does play roller hockey every week. Has been able to continue to play without much pain.  Most pain with Pushing down, forward, taking off sock , reaching behind, out to the side, sleeping on it.  Works from home, desk job.  Hand dominance: Right  PERTINENT HISTORY: CAD   PAIN:  Are you having pain? Yes: NPRS scale: up to 5-8/10 Pain location: L shoulder  Pain description: Sore Aggravating factors: pushing, moving arm up, out to side, behind  Relieving factors:  none stated   PRECAUTIONS: None  WEIGHT BEARING RESTRICTIONS: No  FALLS:  Has patient fallen in last 6 months? No   PLOF: Independent  PATIENT GOALS:  Decreased pain with shoulder movements   NEXT MD VISIT:   OBJECTIVE:   DIAGNOSTIC FINDINGS:   PATIENT SURVEYS :   COGNITION: Overall cognitive status: Within functional limits for tasks assessed     SENSATION: WFL  POSTURE:   UPPER EXTREMITY ROM:   Active ROM Right eval Left eval  Shoulder flexion  A 110/ P 120  Shoulder extension    Shoulder abduction  A 110/  P 120  Shoulder adduction    Shoulder internal rotation  Mild limitation behind back with pain.   Shoulder external rotation  Limited behind back, fingers to mid cervical Supine: mild limitation: pain at end range  Elbow flexion    Elbow extension    Wrist flexion    Wrist extension    Wrist ulnar deviation    Wrist radial deviation    Wrist pronation    Wrist supination    (Blank rows = not tested)  UPPER EXTREMITY MMT:  MMT Right eval Left eval  Shoulder flexion  3  Shoulder extension    Shoulder abduction  3  Shoulder adduction    Shoulder internal rotation  Mild pain 4  Shoulder external rotation  4-  Middle trapezius    Lower trapezius    Elbow flexion    Elbow extension    Wrist flexion    Wrist extension    Wrist ulnar deviation    Wrist radial deviation    Wrist pronation    Wrist supination    Grip  strength (lbs)    (Blank rows = not tested)   JOINT MOBILITY TESTING:  Hypomobile L  GHJ,  pain at end range for flex, abd, and ER   PALPATION:  Minimal soreness to palpate,  Hypomobile L  GHJ,  pain at end range for flex, abd, and ER     TODAY'S TREATMENT:                                                                                                                                         DATE:  04/12/23:  Ther ex: see below for HEP  PATIENT EDUCATION:  Education details: PT POC, Exam findings, HEP Person educated: Patient Education method: Explanation, Demonstration, Tactile cues, Verbal cues, and Handouts Education comprehension: verbalized understanding, returned demonstration, verbal cues required, tactile cues required, and needs further education   HOME EXERCISE PROGRAM: Access Code: [email or web address] URL: [email or web address] Date: 04/13/2023 Prepared by: Sedalia Muta  Exercises - Supine Shoulder Flexion Extension AAROM with Dowel  - 1-2 x daily - 1 sets - 10 reps - 3 hold - Supine Chest Stretch with Elbows Bent  - 2 x daily - 1 sets - 10 reps - Sidelying Shoulder External Rotation  -  1 x daily - 2 sets - 10 reps  ASSESSMENT:  CLINICAL IMPRESSION: Patient presents with primary complaint of increased pain in L shoulder. He has joint stiffness that is limiting full shoulder ROM, as well as pain at end ranges for most all motions. He has decreased ability for full functional activities due to pain and deficit, with limited ability for reach, lift, carry, and IADLs. Pt to benefit from skilled PT to improve deficits and pain.    OBJECTIVE IMPAIRMENTS: decreased activity tolerance, decreased mobility, decreased ROM, decreased strength, hypomobility, increased muscle spasms, impaired UE functional use, and pain.   ACTIVITY LIMITATIONS: carrying, lifting, dressing, reach over head, hygiene/grooming, and locomotion level  PARTICIPATION LIMITATIONS: meal prep,  cleaning, laundry, and community activity  PERSONAL FACTORS:  none  are also affecting patient's functional outcome.   REHAB POTENTIAL: Good  CLINICAL DECISION MAKING: Stable/uncomplicated  EVALUATION COMPLEXITY: Low  GOALS: Goals reviewed with patient? Yes  SHORT TERM GOALS: Target date: 04/26/2023    Pt to be independent with initial HEP  Goal status: INITIAL  2.  Pt to demo at least 15 deg improvement in elevation without pain  Goal status: INITIAL     LONG TERM GOALS: Target date: 06/07/2023   Pt to be independent with final HEP  Goal status: INITIAL  2.  Pt to demo improved AROM for L shoulder/all motions  to be [facility] to improve ability for lift, reach, and IADLS.   Goal status: INITIAL  3.  Pt to report decreased pain in L shoulder to 0-2/10 with activity, to improve ability for ADLs .   Goal status: INITIAL    PLAN: PT FREQUENCY: 1-2x/week  PT DURATION: 8 weeks  PLANNED INTERVENTIONS: Therapeutic exercises, Therapeutic activity, Neuromuscular re-education, Patient/Family education, Self Care, Joint mobilization, Joint manipulation, Stair training, Orthotic/Fit training, DME instructions, Aquatic Therapy, Dry Needling, Electrical stimulation, Cryotherapy, Moist heat, Taping, Ultrasound, Ionotophoresis 4mg /ml Dexamethasone, Manual therapy,  Vasopneumatic device,    PLAN FOR NEXT SESSION: PROM, joint mobs, LAD, AAROM, RTC strength    Sedalia Muta, PT, DPT 9:19 AM  04/13/23

## 2023-04-13 ENCOUNTER — Encounter: Payer: Self-pay | Admitting: Physical Therapy

## 2023-04-14 ENCOUNTER — Ambulatory Visit (INDEPENDENT_AMBULATORY_CARE_PROVIDER_SITE_OTHER): Payer: 59 | Admitting: Physical Therapy

## 2023-04-14 ENCOUNTER — Encounter: Payer: Self-pay | Admitting: Physical Therapy

## 2023-04-14 DIAGNOSIS — M25512 Pain in left shoulder: Secondary | ICD-10-CM

## 2023-04-14 NOTE — Therapy (Signed)
OUTPATIENT PHYSICAL THERAPY UPPER EXTREMITY TREATMENT   Patient Name: Edward Medina MRN: [id number] DOB:06/27/1956, 67 y.o., male Today's Date: 04/14/2023  END OF SESSION:  PT End of Session - 04/14/23 1605     Visit Number 2    Number of Visits 16    Date for PT Re-Evaluation 06/07/23    Authorization Type Clementeen Graham    PT Start Time 1606    PT Stop Time 1645    PT Time Calculation (min) 39 min    Activity Tolerance Patient tolerated treatment well    Behavior During Therapy [facility] for tasks assessed/performed             Past Medical History:  Diagnosis Date   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   FH: CAD (coronary artery disease) 12/24/2015   High cholesterol    Migraines    Past Surgical History:  Procedure Laterality Date   HERNIA REPAIR  2005   STRABISMUS SURGERY Bilateral 07/23/2022   Procedure: REPAIR STRABISMUS BILATERAL;  Surgeon: French Ana, MD;  Location: [facility];  Service: Ophthalmology;  Laterality: Bilateral;   TONSILLECTOMY  1967   WISDOM TOOTH EXTRACTION  1976   Patient Active Problem List   Diagnosis Date Noted   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   Hyperglycemia 11/17/2022   Tinnitus of right ear 10/28/2021   Statin intolerance 10/27/2020   Constipation 10/27/2020   Gynecomastia 09/23/2017   Coronary artery disease due to lipid rich plaque 12/24/2015   Hyperlipidemia LDL goal <100 01/01/2015   Osteopenia 11/23/2013   Benign prostatic hyperplasia 12/15/2012    PCP: Jacquiline Doe  REFERRING PROVIDER: Clementeen Graham   REFERRING DIAG: L shoulder pain   THERAPY DIAG:  Acute pain of left shoulder  Rationale for Evaluation and Treatment: Rehabilitation  ONSET DATE: march 2024  SUBJECTIVE:                                                                                                                                                                                      SUBJECTIVE STATEMENT: Pt with no new complaints. Has been  doing HEP.   Eval: Pt states sudden onset of L shoulder pain. Has not had pain previously. He does not recall incident, but does play roller hockey every week. Has been able to continue to play without much pain.  Most pain with Pushing down, forward, taking off sock , reaching behind, out to the side, sleeping on it.  Works from home, desk job.  Hand dominance: Right  PERTINENT HISTORY: CAD   PAIN:  Are you having pain? Yes: NPRS scale: up to 5-8/10 Pain location: L shoulder  Pain description: Sore  Aggravating factors: pushing, moving arm up, out to side, behind  Relieving factors: none stated   PRECAUTIONS: None  WEIGHT BEARING RESTRICTIONS: No  FALLS:  Has patient fallen in last 6 months? No   PLOF: Independent  PATIENT GOALS:  Decreased pain with shoulder movements   NEXT MD VISIT:   OBJECTIVE:   DIAGNOSTIC FINDINGS:   PATIENT SURVEYS :   COGNITION: Overall cognitive status: Within functional limits for tasks assessed     SENSATION: WFL  POSTURE:   UPPER EXTREMITY ROM:   Active ROM Right eval Left eval  Shoulder flexion  A 110/ P 120  Shoulder extension    Shoulder abduction  A 110/  P 120  Shoulder adduction    Shoulder internal rotation  Mild limitation behind back with pain.   Shoulder external rotation  Limited behind back, fingers to mid cervical Supine: mild limitation: pain at end range  Elbow flexion    Elbow extension    Wrist flexion    Wrist extension    Wrist ulnar deviation    Wrist radial deviation    Wrist pronation    Wrist supination    (Blank rows = not tested)  UPPER EXTREMITY MMT:  MMT Right eval Left eval  Shoulder flexion  3  Shoulder extension    Shoulder abduction  3  Shoulder adduction    Shoulder internal rotation  Mild pain 4  Shoulder external rotation  4-  Middle trapezius    Lower trapezius    Elbow flexion    Elbow extension    Wrist flexion    Wrist extension    Wrist ulnar deviation    Wrist  radial deviation    Wrist pronation    Wrist supination    Grip strength (lbs)    (Blank rows = not tested)   JOINT MOBILITY TESTING:  Hypomobile L  GHJ,  pain at end range for flex, abd, and ER   PALPATION:  Minimal soreness to palpate,  Hypomobile L  GHJ,  pain at end range for flex, abd, and ER     TODAY'S TREATMENT:                                                                                                                                         DATE:   04/14/23:  Therapeutic Exercise: Aerobic: Supine: Shoulder flexion/cane 5 sec x 10;  Shoulder ER butterfly x 10 (mild pain) S/L:  ER  2 x 10;  Seated: Shoulder pulley/flexion x 20;  Standing: Wall slides L only x 15/flexion;  Stretches:  Neuromuscular Re-education: Manual Therapy:  GHJ mobs post and inf, LAD, PROM for all motions.    PATIENT EDUCATION:  Education details: updated and reviewed HEP Person educated: Patient Education method: Explanation, Demonstration, Tactile cues, Verbal cues, and Handouts Education comprehension: verbalized understanding, returned demonstration, verbal cues required, tactile cues required, and needs further  education   HOME EXERCISE PROGRAM: Access Code: [email or web address] URL: [name] Date: 04/13/2023 Prepared by: Sedalia Muta  Exercises - Supine Shoulder Flexion Extension AAROM with Dowel  - 1-2 x daily - 1 sets - 10 reps - 3 hold - Supine Chest Stretch with Elbows Bent  - 2 x daily - 1 sets - 10 reps - Sidelying Shoulder External Rotation  - 1 x daily - 2 sets - 10 reps  ASSESSMENT:  CLINICAL IMPRESSION:  04/14/2023 Pt with good tolerance for manual and joint mobs. He has stiffness mostly for flex and abd, but also has pain at end range for ER as well. He is able to do light strength for ER without pain. Plan to focus on restoring ROM, and light strength for rtc.   Eval: Patient presents with primary complaint of increased pain in L shoulder. He has  joint stiffness that is limiting full shoulder ROM, as well as pain at end ranges for most all motions. He has decreased ability for full functional activities due to pain and deficit, with limited ability for reach, lift, carry, and IADLs. Pt to benefit from skilled PT to improve deficits and pain.    OBJECTIVE IMPAIRMENTS: decreased activity tolerance, decreased mobility, decreased ROM, decreased strength, hypomobility, increased muscle spasms, impaired UE functional use, and pain.   ACTIVITY LIMITATIONS: carrying, lifting, dressing, reach over head, hygiene/grooming, and locomotion level  PARTICIPATION LIMITATIONS: meal prep, cleaning, laundry, and community activity  PERSONAL FACTORS:  none  are also affecting patient's functional outcome.   REHAB POTENTIAL: Good  CLINICAL DECISION MAKING: Stable/uncomplicated  EVALUATION COMPLEXITY: Low  GOALS: Goals reviewed with patient? Yes  SHORT TERM GOALS: Target date: 04/26/2023    Pt to be independent with initial HEP  Goal status: INITIAL  2.  Pt to demo at least 15 deg improvement in elevation without pain  Goal status: INITIAL     LONG TERM GOALS: Target date: 06/07/2023   Pt to be independent with final HEP  Goal status: INITIAL  2.  Pt to demo improved AROM for L shoulder/all motions  to be [facility] to improve ability for lift, reach, and IADLS.   Goal status: INITIAL  3.  Pt to report decreased pain in L shoulder to 0-2/10 with activity, to improve ability for ADLs .   Goal status: INITIAL    PLAN: PT FREQUENCY: 1-2x/week  PT DURATION: 8 weeks  PLANNED INTERVENTIONS: Therapeutic exercises, Therapeutic activity, Neuromuscular re-education, Patient/Family education, Self Care, Joint mobilization, Joint manipulation, Stair training, Orthotic/Fit training, DME instructions, Aquatic Therapy, Dry Needling, Electrical stimulation, Cryotherapy, Moist heat, Taping, Ultrasound, Ionotophoresis 4mg /ml Dexamethasone, Manual  therapy,  Vasopneumatic device,    PLAN FOR NEXT SESSION: PROM, joint mobs, LAD, AAROM, RTC strength    Sedalia Muta, PT, DPT 12:16 PM  04/15/23

## 2023-04-15 ENCOUNTER — Encounter: Payer: Self-pay | Admitting: Physical Therapy

## 2023-04-18 ENCOUNTER — Encounter: Payer: Self-pay | Admitting: Physical Therapy

## 2023-04-18 ENCOUNTER — Ambulatory Visit (INDEPENDENT_AMBULATORY_CARE_PROVIDER_SITE_OTHER): Payer: 59 | Admitting: Physical Therapy

## 2023-04-18 DIAGNOSIS — M25512 Pain in left shoulder: Secondary | ICD-10-CM

## 2023-04-18 NOTE — Therapy (Signed)
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   FH: CAD (coronary artery disease) 12/24/2015   High cholesterol    Migraines    Past Surgical History:  Procedure Laterality Date   HERNIA REPAIR  2005   STRABISMUS SURGERY Bilateral 07/23/2022   Procedure: REPAIR STRABISMUS BILATERAL;  Surgeon: French Ana, MD;  Location: [facility];  Service: Ophthalmology;  Laterality: Bilateral;   TONSILLECTOMY  1967   WISDOM TOOTH EXTRACTION  1976   Patient Active Problem List   Diagnosis Date Noted   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   Hyperglycemia 11/17/2022   Tinnitus of right ear 10/28/2021   Statin intolerance 10/27/2020   Constipation 10/27/2020   Gynecomastia 09/23/2017   Coronary artery disease due to lipid rich plaque 12/24/2015   Hyperlipidemia LDL goal <100 01/01/2015   Osteopenia 11/23/2013   Benign prostatic hyperplasia 12/15/2012    PCP: Jacquiline Doe  REFERRING PROVIDER: Clementeen Graham   REFERRING DIAG: L shoulder pain   THERAPY DIAG:  Acute pain of left shoulder  Rationale for Evaluation and Treatment: Rehabilitation  ONSET DATE: march 2024  SUBJECTIVE:                                                                                                                                                                                      SUBJECTIVE STATEMENT: Pt with no new complaints. Has been doing  HEP. Shoulder ER in supine has been painful.   Eval: Pt states sudden onset of L shoulder pain. Has not had pain previously. He does not recall incident, but does play roller hockey every week. Has been able to continue to play without much pain.  Most pain with Pushing down, forward, taking off sock , reaching behind, out to the side, sleeping on it.  Works from home, desk job.  Hand dominance: Right  PERTINENT HISTORY: CAD   PAIN:  Are you having pain? Yes: NPRS scale: up to 5-8/10 Pain location:  L shoulder  Pain description: Sore Aggravating factors: pushing, moving arm up, out to side, behind  Relieving factors: none stated   PRECAUTIONS: None  WEIGHT BEARING RESTRICTIONS: No  FALLS:  Has patient fallen in last 6 months? No   PLOF: Independent  PATIENT GOALS:  Decreased pain with shoulder movements   NEXT MD VISIT:   OBJECTIVE:   DIAGNOSTIC FINDINGS:   PATIENT SURVEYS :   COGNITION: Overall cognitive status: Within functional limits for tasks assessed     SENSATION: WFL  POSTURE:   UPPER EXTREMITY ROM:   Active ROM Right eval Left eval  Shoulder flexion 150 A A 110/ P 120  Shoulder extension    Shoulder abduction  A 110/  P 120  Shoulder adduction    Shoulder internal rotation  Mild limitation behind back with pain.   Shoulder external rotation  Limited behind back, fingers to mid cervical Supine: mild limitation: pain at end range  Elbow flexion    Elbow extension    Wrist flexion    Wrist extension    Wrist ulnar deviation    Wrist radial deviation    Wrist pronation    Wrist supination    (Blank rows = not tested)  UPPER EXTREMITY MMT:  MMT Right eval Left eval  Shoulder flexion  3  Shoulder extension    Shoulder abduction  3  Shoulder adduction    Shoulder internal rotation  Mild pain 4  Shoulder external rotation  4-  Middle trapezius    Lower trapezius    Elbow flexion    Elbow extension    Wrist flexion    Wrist extension     Wrist ulnar deviation    Wrist radial deviation    Wrist pronation    Wrist supination    Grip strength (lbs)    (Blank rows = not tested)   JOINT MOBILITY TESTING:  Hypomobile L  GHJ,  pain at end range for flex, abd, and ER   PALPATION:  Minimal soreness to palpate,  Hypomobile L  GHJ,  pain at end range for flex, abd, and ER     TODAY'S TREATMENT:                                                                                                                                         DATE:   04/18/23: Therapeutic Exercise: Aerobic: Supine: Shoulder flexion/cane 5 sec x 10;  shoulder ER/cane x 10;  S/L:  ER  2 x 10, 2lb;  Seated: Shoulder pulley/flexion and abd x 20;   shoulder ER/cane x 15  Standing: Wall slides L only x 15/flexion; shoulder ER, YTB 2 x 10;  Stretches:  Neuromuscular Re-education: Manual Therapy:  GHJ mobs post and inf, LAD, PROM for all motions.   04/14/23:  Therapeutic Exercise: Aerobic: Supine: Shoulder flexion/cane 5 sec x 10;  Shoulder ER butterfly x 10 (mild  pain) S/L:  ER  2 x 10;  Seated: Shoulder pulley/flexion x 20;  Standing: Wall slides L only x 15/flexion;  Stretches:  Neuromuscular Re-education: Manual Therapy:  GHJ mobs post and inf, LAD, PROM for all motions.    PATIENT EDUCATION:  Education details: updated and reviewed HEP Person educated: Patient Education method: Explanation, Demonstration, Tactile cues, Verbal cues, and Handouts Education comprehension: verbalized understanding, returned demonstration, verbal cues required, tactile cues required, and needs further education   HOME EXERCISE PROGRAM: Access Code: [email or web address] URL: [email or web address] Date: 04/13/2023 Prepared by: Sedalia Muta  Exercises - Supine Shoulder Flexion Extension AAROM with Dowel  - 1-2 x daily - 1 sets - 10 reps - 3 hold - Supine Chest Stretch with Elbows Bent  - 2 x daily - 1 sets - 10 reps - Sidelying Shoulder External Rotation   - 1 x daily - 2 sets - 10 reps  ASSESSMENT:  CLINICAL IMPRESSION:  04/18/2023 Pt with improving PROM and AROM for elevation. He has minimal pain with activities today, and is able to do light strength for ER. Updated HEP, will not do Er in supine anymore, as it has been painful. Plan to continue ROM and light strength as able.   Eval: Patient presents with primary complaint of increased pain in L shoulder. He has joint stiffness that is limiting full shoulder ROM, as well as pain at end ranges for most all motions. He has decreased ability for full functional activities due to pain and deficit, with limited ability for reach, lift, carry, and IADLs. Pt to benefit from skilled PT to improve deficits and pain.    OBJECTIVE IMPAIRMENTS: decreased activity tolerance, decreased mobility, decreased ROM, decreased strength, hypomobility, increased muscle spasms, impaired UE functional use, and pain.   ACTIVITY LIMITATIONS: carrying, lifting, dressing, reach over head, hygiene/grooming, and locomotion level  PARTICIPATION LIMITATIONS: meal prep, cleaning, laundry, and community activity  PERSONAL FACTORS:  none  are also affecting patient's functional outcome.   REHAB POTENTIAL: Good  CLINICAL DECISION MAKING: Stable/uncomplicated  EVALUATION COMPLEXITY: Low  GOALS: Goals reviewed with patient? Yes  SHORT TERM GOALS: Target date: 04/26/2023    Pt to be independent with initial HEP  Goal status: INITIAL  2.  Pt to demo at least 15 deg improvement in elevation without pain  Goal status: INITIAL     LONG TERM GOALS: Target date: 06/07/2023   Pt to be independent with final HEP  Goal status: INITIAL  2.  Pt to demo improved AROM for L shoulder/all motions  to be [facility] to improve ability for lift, reach, and IADLS.   Goal status: INITIAL  3.  Pt to report decreased pain in L shoulder to 0-2/10 with activity, to improve ability for ADLs .   Goal status: INITIAL    PLAN: PT  FREQUENCY: 1-2x/week  PT DURATION: 8 weeks  PLANNED INTERVENTIONS: Therapeutic exercises, Therapeutic activity, Neuromuscular re-education, Patient/Family education, Self Care, Joint mobilization, Joint manipulation, Stair training, Orthotic/Fit training, DME instructions, Aquatic Therapy, Dry Needling, Electrical stimulation, Cryotherapy, Moist heat, Taping, Ultrasound, Ionotophoresis 4mg /ml Dexamethasone, Manual therapy,  Vasopneumatic device,    PLAN FOR NEXT SESSION: PROM, joint mobs, LAD, AAROM, RTC strength    Sedalia Muta, PT, DPT 8:53 AM  04/18/23

## 2023-04-22 ENCOUNTER — Encounter: Payer: Self-pay | Admitting: Physical Therapy

## 2023-04-22 ENCOUNTER — Ambulatory Visit (INDEPENDENT_AMBULATORY_CARE_PROVIDER_SITE_OTHER): Payer: 59 | Admitting: Physical Therapy

## 2023-04-22 DIAGNOSIS — M25512 Pain in left shoulder: Secondary | ICD-10-CM | POA: Diagnosis not present

## 2023-04-22 NOTE — Therapy (Signed)
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   FH: CAD (coronary artery disease) 12/24/2015   High cholesterol    Migraines    Past Surgical History:  Procedure Laterality Date   HERNIA REPAIR  2005   STRABISMUS SURGERY Bilateral 07/23/2022   Procedure: REPAIR STRABISMUS BILATERAL;  Surgeon: French Ana, MD;  Location: [facility];  Service: Ophthalmology;  Laterality: Bilateral;   TONSILLECTOMY  1967   WISDOM TOOTH EXTRACTION  1976   Patient Active Problem List   Diagnosis Date Noted   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   Hyperglycemia 11/17/2022   Tinnitus of right ear 10/28/2021   Statin intolerance 10/27/2020   Constipation 10/27/2020   Gynecomastia 09/23/2017   Coronary artery disease due to lipid rich plaque 12/24/2015   Hyperlipidemia LDL goal <100 01/01/2015   Osteopenia 11/23/2013   Benign prostatic hyperplasia 12/15/2012    PCP: Jacquiline Doe  REFERRING PROVIDER: Clementeen Graham   REFERRING DIAG: L shoulder pain   THERAPY DIAG:  Acute pain of left shoulder  Rationale for Evaluation and Treatment: Rehabilitation  ONSET DATE: march 2024  SUBJECTIVE:                                                                                                                                                                                      SUBJECTIVE STATEMENT: Pt feels shoulder is doing a bit better.  Still popping, with pain with reaching fwd motion.   Eval: Pt states sudden onset of L shoulder pain. Has not had pain previously. He does not recall incident, but does play roller hockey every week. Has been able to continue to play without much pain.  Most pain with Pushing down, forward, taking off sock , reaching behind, out to the side, sleeping on it.  Works from home, desk job.  Hand dominance: Right  PERTINENT HISTORY: CAD   PAIN:  Are you having pain? Yes: NPRS scale: up to 5-8/10 Pain location:  L shoulder  Pain description: Sore Aggravating factors: pushing, moving arm up, out to side, behind  Relieving factors: none stated   PRECAUTIONS: None  WEIGHT BEARING RESTRICTIONS: No  FALLS:  Has patient fallen in last 6 months? No   PLOF: Independent  PATIENT GOALS:  Decreased pain with shoulder movements   NEXT MD VISIT:   OBJECTIVE:   DIAGNOSTIC FINDINGS:   PATIENT SURVEYS :   COGNITION: Overall cognitive status: Within functional limits for tasks assessed     SENSATION: WFL  POSTURE:   UPPER EXTREMITY ROM:   Active ROM Right eval Left eval  Shoulder flexion 150 A A 110/ P 120  Shoulder extension    Shoulder abduction  A 110/  P 120  Shoulder adduction    Shoulder internal rotation  Mild limitation behind back with pain.   Shoulder external rotation  Limited behind back, fingers to mid cervical Supine: mild limitation: pain at end range  Elbow flexion    Elbow extension    Wrist flexion    Wrist extension    Wrist ulnar deviation    Wrist radial deviation    Wrist pronation    Wrist supination    (Blank rows = not tested)  UPPER EXTREMITY MMT:  MMT Right eval Left eval  Shoulder flexion  3  Shoulder extension    Shoulder abduction  3  Shoulder adduction    Shoulder internal rotation  Mild pain 4  Shoulder external rotation  4-  Middle trapezius    Lower trapezius    Elbow flexion    Elbow extension    Wrist flexion    Wrist  extension    Wrist ulnar deviation    Wrist radial deviation    Wrist pronation    Wrist supination    Grip strength (lbs)    (Blank rows = not tested)   JOINT MOBILITY TESTING:  Hypomobile L  GHJ,  pain at end range for flex, abd, and ER   PALPATION:  Minimal soreness to palpate,  Hypomobile L  GHJ,  pain at end range for flex, abd, and ER     TODAY'S TREATMENT:                                                                                                                                         DATE:   04/22/23: Therapeutic Exercise: Aerobic: Supine:  Quadruped:  SA presses x 15;  S/L:  abd 2 x 10,  horiz abd 2 x 10;  Seated: Shoulder pulley/flexion and abd x 20;  lat press into table x 10, 3 sec holds  Standing: Wall slides L only x 15/flexion; shoulder ER, GTB x15 ,  IR GTB x 15;  Stretches:  Neuromuscular Re-education: Manual Therapy:  GHJ mobs post and inf, LAD, PROM for all motions.    04/18/23: Therapeutic Exercise: Aerobic: Supine: Shoulder flexion/cane 5 sec x  10;  shoulder ER/cane x 10;  S/L:  ER  2 x 10, 2lb;  Seated: Shoulder pulley/flexion and abd x 20;   shoulder ER/cane x 15  Standing: Wall slides L only x 15/flexion; shoulder ER, YTB 2 x 10;  Stretches:  Neuromuscular Re-education: Manual Therapy:  GHJ mobs post and inf, LAD, PROM for all motions.   04/14/23:  Therapeutic Exercise: Aerobic: Supine: Shoulder flexion/cane 5 sec x 10;  Shoulder ER butterfly x 10 (mild pain) S/L:  ER  2 x 10;  Seated: Shoulder pulley/flexion x 20;  Standing: Wall slides L only x 15/flexion;  Stretches:  Neuromuscular Re-education: Manual Therapy:  GHJ mobs post and inf, LAD, PROM for all motions.    PATIENT EDUCATION:  Education details: updated and reviewed HEP Person educated: Patient Education method: Explanation, Demonstration, Tactile cues, Verbal cues, and Handouts Education comprehension: verbalized understanding, returned demonstration, verbal cues  required, tactile cues required, and needs further education   HOME EXERCISE PROGRAM: Access Code: [email or web address] URL: [email or web address] Date: 04/13/2023 Prepared by: Sedalia Muta  Exercises - Supine Shoulder Flexion Extension AAROM with Dowel  - 1-2 x daily - 1 sets - 10 reps - 3 hold - Supine Chest Stretch with Elbows Bent  - 2 x daily - 1 sets - 10 reps - Sidelying Shoulder External Rotation  - 1 x daily - 2 sets - 10 reps  ASSESSMENT:  CLINICAL IMPRESSION:  04/22/2023 Pt with improving ability for ROM and strengthening. He still has limitations for full ROM, but improved ability for strengthening and AROM today with little pain. Updated HEP to include strength for posterior shoulder. Challenged with SA press and shoulder stability, will continue to progress this.   Eval: Patient presents with primary complaint of increased pain in L shoulder. He has joint stiffness that is limiting full shoulder ROM, as well as pain at end ranges for most all motions. He has decreased ability for full functional activities due to pain and deficit, with limited ability for reach, lift, carry, and IADLs. Pt to benefit from skilled PT to improve deficits and pain.    OBJECTIVE IMPAIRMENTS: decreased activity tolerance, decreased mobility, decreased ROM, decreased strength, hypomobility, increased muscle spasms, impaired UE functional use, and pain.   ACTIVITY LIMITATIONS: carrying, lifting, dressing, reach over head, hygiene/grooming, and locomotion level  PARTICIPATION LIMITATIONS: meal prep, cleaning, laundry, and community activity  PERSONAL FACTORS:  none  are also affecting patient's functional outcome.   REHAB POTENTIAL: Good  CLINICAL DECISION MAKING: Stable/uncomplicated  EVALUATION COMPLEXITY: Low  GOALS: Goals reviewed with patient? Yes  SHORT TERM GOALS: Target date: 04/26/2023    Pt to be independent with initial HEP  Goal status: INITIAL  2.  Pt to demo at  least 15 deg improvement in elevation without pain  Goal status: INITIAL     LONG TERM GOALS: Target date: 06/07/2023   Pt to be independent with final HEP  Goal status: INITIAL  2.  Pt to demo improved AROM for L shoulder/all motions  to be [facility] to improve ability for lift, reach, and IADLS.   Goal status: INITIAL  3.  Pt to report decreased pain in L shoulder to 0-2/10 with activity, to improve ability for ADLs .   Goal status: INITIAL    PLAN: PT FREQUENCY: 1-2x/week  PT DURATION: 8 weeks  PLANNED INTERVENTIONS: Therapeutic exercises, Therapeutic activity, Neuromuscular re-education, Patient/Family education, Self Care, Joint mobilization, Joint manipulation, Stair training, Orthotic/Fit training, DME instructions, Aquatic Therapy, Dry Needling, Electrical stimulation,  Cryotherapy, Moist heat, Taping, Ultrasound, Ionotophoresis 4mg /ml Dexamethasone, Manual therapy,  Vasopneumatic device,    PLAN FOR NEXT SESSION: PROM, joint mobs, LAD, AAROM, RTC strength    Sedalia Muta, PT, DPT 10:41 AM  04/22/23

## 2023-04-25 ENCOUNTER — Ambulatory Visit (INDEPENDENT_AMBULATORY_CARE_PROVIDER_SITE_OTHER): Payer: 59 | Admitting: Physical Therapy

## 2023-04-25 ENCOUNTER — Encounter: Payer: Self-pay | Admitting: Physical Therapy

## 2023-04-25 DIAGNOSIS — M25512 Pain in left shoulder: Secondary | ICD-10-CM | POA: Diagnosis not present

## 2023-04-25 NOTE — Research (Signed)
Updated  Non-Fatal Potential Endpoint Assessment Yes  No   Has the subject experienced/undergone any of the following since the last visit/contact?   []  [x]   Any Coronary Artery Revascularization/Cerebrovascular Revascularization/ Peripheral Artery Revascularization/Amputation Procedure   []  [x]   Myocardial Infarction []  [x]   Stroke   []  [x]  Provide the date for the non-fatal Potential Endpoints status:   []  [x]   

## 2023-04-25 NOTE — Research (Signed)
upated  Non-Fatal Potential Endpoint Assessment Yes  No   Has the subject experienced/undergone any of the following since the last visit/contact?   []   [x]    Any Coronary Artery Revascularization/Cerebrovascular Revascularization/ Peripheral Artery Revascularization/Amputation Procedure   []   [x]    Myocardial Infarction []   [x]    Stroke   []   [x]   Provide the date for the non-fatal Potential Endpoints status:   []   [x] 

## 2023-04-25 NOTE — Research (Signed)
Updated  Non-Fatal Potential Endpoint Assessment Yes  No   Has the subject experienced/undergone any of the following since the last visit/contact?   []   [x]    Any Coronary Artery Revascularization/Cerebrovascular Revascularization/ Peripheral Artery Revascularization/Amputation Procedure   []   [x]    Myocardial Infarction []   [x]    Stroke   []   [x]   Provide the date for the non-fatal Potential Endpoints status:   []   [x] 

## 2023-04-25 NOTE — Research (Signed)
NTF: subject was given 8 injectors to take home. He did not administer injection here.

## 2023-04-25 NOTE — Therapy (Signed)
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   FH: CAD (coronary artery disease) 12/24/2015   High cholesterol    Migraines    Past Surgical History:  Procedure Laterality Date   HERNIA REPAIR  2005   STRABISMUS SURGERY Bilateral 07/23/2022   Procedure: REPAIR STRABISMUS BILATERAL;  Surgeon: French Ana, MD;  Location: [facility];  Service: Ophthalmology;  Laterality: Bilateral;   TONSILLECTOMY  1967   WISDOM TOOTH EXTRACTION  1976   Patient Active Problem List   Diagnosis Date Noted   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   Hyperglycemia 11/17/2022   Tinnitus of right ear 10/28/2021   Statin intolerance 10/27/2020   Constipation 10/27/2020   Gynecomastia 09/23/2017   Coronary artery disease due to lipid rich plaque 12/24/2015   Hyperlipidemia LDL goal <100 01/01/2015   Osteopenia 11/23/2013   Benign prostatic hyperplasia 12/15/2012    PCP: Jacquiline Doe  REFERRING PROVIDER: Clementeen Graham   REFERRING DIAG: L shoulder pain   THERAPY DIAG:  Acute pain of left shoulder  Rationale for Evaluation and Treatment: Rehabilitation  ONSET DATE: march 2024  SUBJECTIVE:                                                                                                                                                                                      SUBJECTIVE STATEMENT: Pt feels shoulder was more sore in the last  couple days.   Eval: Pt states sudden onset of L shoulder pain. Has not had pain previously. He does not recall incident, but does play roller hockey every week. Has been able to continue to play without much pain.  Most pain with Pushing down, forward, taking off sock , reaching behind, out to the side, sleeping on it.  Works from home, desk job.  Hand dominance: Right  PERTINENT HISTORY: CAD   PAIN:  Are you having pain? Yes: NPRS scale: up to 5-8/10 Pain location: L shoulder  Pain description:  Sore Aggravating factors: pushing, moving arm up, out to side, behind  Relieving factors: none stated   PRECAUTIONS: None  WEIGHT BEARING RESTRICTIONS: No  FALLS:  Has patient fallen in last 6 months? No   PLOF: Independent  PATIENT GOALS:  Decreased pain with shoulder movements   NEXT MD VISIT:   OBJECTIVE:   DIAGNOSTIC FINDINGS:   PATIENT SURVEYS :   COGNITION: Overall cognitive status: Within functional limits for tasks assessed     SENSATION: WFL  POSTURE:   UPPER EXTREMITY ROM:   Active ROM Right eval Left eval  Shoulder flexion 150 A A 110/ P 120  Shoulder extension    Shoulder abduction  A 110/  P 120  Shoulder adduction    Shoulder internal rotation  Mild limitation behind back with pain.   Shoulder external rotation  Limited behind back, fingers to mid cervical Supine: mild limitation: pain at end range  Elbow flexion    Elbow extension    Wrist flexion    Wrist extension    Wrist ulnar deviation    Wrist radial deviation    Wrist pronation    Wrist supination    (Blank rows = not tested)  UPPER EXTREMITY MMT:  MMT Right eval Left eval  Shoulder flexion  3  Shoulder extension    Shoulder abduction  3  Shoulder adduction    Shoulder internal rotation  Mild pain 4  Shoulder external rotation  4-  Middle trapezius    Lower trapezius    Elbow flexion    Elbow extension    Wrist flexion    Wrist extension    Wrist ulnar deviation     Wrist radial deviation    Wrist pronation    Wrist supination    Grip strength (lbs)    (Blank rows = not tested)   JOINT MOBILITY TESTING:  Hypomobile L  GHJ,  pain at end range for flex, abd, and ER   PALPATION:  Minimal soreness to palpate,  Hypomobile L  GHJ,  pain at end range for flex, abd, and ER     TODAY'S TREATMENT:                                                                                                                                         DATE:   04/25/23: Therapeutic Exercise: Aerobic: Supine:  Quadruped:  S/L:  abd 2 x 10,  horiz abd 2 x 10; Shoulder ER  x 10,  2lb x 10  Seated: Shoulder pulley/flexion and abd x 20;   Standing:; shoulder bil ER, RTB x15 ,  Ball rolls for flex/abd x 10 ea;  IR behind back, series with stick x 10 ea;  Stretches:  Neuromuscular Re-education: Manual Therapy:  GHJ mobs post and inf, LAD, PROM for all motions.    04/22/23: Therapeutic Exercise: Aerobic: Supine:  Quadruped:  SA presses x 15;  S/L:  abd 2 x 10,  horiz abd 2 x 10;  Seated: Shoulder pulley/flexion and abd x 20;  lat press into table x 10, 3 sec holds  Standing: Wall slides L only x 15/flexion; shoulder ER, GTB x15 ,  IR GTB x 15;  Stretches:  Neuromuscular Re-education: Manual Therapy:  GHJ mobs post and inf, LAD, PROM for all motions.    04/18/23: Therapeutic Exercise: Aerobic: Supine: Shoulder flexion/cane 5 sec x 10;  shoulder ER/cane x 10;  S/L:  ER  2 x 10, 2lb;  Seated: Shoulder pulley/flexion and abd x 20;   shoulder ER/cane x 15  Standing: Wall slides L only x 15/flexion; shoulder ER, YTB 2 x 10;  Stretches:  Neuromuscular Re-education: Manual Therapy:  GHJ mobs post and inf, LAD, PROM for all motions.    PATIENT EDUCATION:  Education details: updated and reviewed HEP Person educated: Patient Education method: Explanation, Demonstration, Tactile cues, Verbal cues, and Handouts Education comprehension: verbalized understanding, returned  demonstration, verbal cues required, tactile cues required, and needs further education   HOME EXERCISE PROGRAM: Access Code: [email or web address]  ASSESSMENT:  CLINICAL IMPRESSION: 04/25/2023 Pt with good tolerance for exercises today with little pain. Discussed continuing ROM and light strength as able this week. He is improving with ROM and overall function, but still limited with full joint ROM , strength, and pain.   Eval: Patient presents with primary complaint of increased pain in L shoulder. He has joint stiffness that is limiting full shoulder ROM, as well as pain at end ranges for most all motions. He has decreased ability for full functional activities due to pain and deficit, with limited ability for reach, lift, carry, and IADLs. Pt to benefit from skilled PT to improve deficits and pain.    OBJECTIVE IMPAIRMENTS: decreased activity tolerance, decreased mobility, decreased ROM, decreased strength, hypomobility, increased muscle spasms, impaired UE functional use, and pain.   ACTIVITY LIMITATIONS: carrying, lifting, dressing, reach over head, hygiene/grooming, and locomotion level  PARTICIPATION LIMITATIONS: meal prep, cleaning, laundry, and community activity  PERSONAL FACTORS:  none  are also affecting patient's functional outcome.   REHAB POTENTIAL: Good  CLINICAL DECISION MAKING: Stable/uncomplicated  EVALUATION COMPLEXITY: Low  GOALS: Goals reviewed with patient? Yes  SHORT TERM GOALS: Target date: 04/26/2023    Pt to be independent with initial HEP  Goal status: INITIAL  2.  Pt to demo at least 15 deg improvement in elevation without pain  Goal status: INITIAL     LONG TERM GOALS: Target date: 06/07/2023   Pt to be independent with final HEP  Goal status: INITIAL  2.  Pt to demo improved AROM for L shoulder/all motions  to be [facility] to improve ability for lift, reach, and IADLS.   Goal status: INITIAL  3.  Pt to report decreased pain in L shoulder to 0-2/10  with activity, to improve ability for ADLs .   Goal status: INITIAL    PLAN: PT FREQUENCY: 1-2x/week  PT DURATION: 8 weeks  PLANNED INTERVENTIONS: Therapeutic exercises, Therapeutic activity, Neuromuscular re-education, Patient/Family education, Self Care, Joint mobilization, Joint manipulation, Stair training, Orthotic/Fit training, DME instructions, Aquatic Therapy, Dry Needling, Electrical stimulation, Cryotherapy, Moist heat, Taping, Ultrasound, Ionotophoresis 4mg /ml Dexamethasone, Manual therapy,  Vasopneumatic device,    PLAN FOR NEXT SESSION: PROM, joint mobs, LAD, AAROM, RTC strength    Sedalia Muta, PT, DPT 8:44 AM  04/25/23

## 2023-04-27 ENCOUNTER — Other Ambulatory Visit: Payer: Self-pay | Admitting: Otolaryngology

## 2023-04-27 DIAGNOSIS — H9190 Unspecified hearing loss, unspecified ear: Secondary | ICD-10-CM

## 2023-04-28 ENCOUNTER — Encounter: Payer: Self-pay | Admitting: Physical Therapy

## 2023-04-28 ENCOUNTER — Ambulatory Visit (INDEPENDENT_AMBULATORY_CARE_PROVIDER_SITE_OTHER): Payer: 59 | Admitting: Physical Therapy

## 2023-04-28 DIAGNOSIS — M25512 Pain in left shoulder: Secondary | ICD-10-CM

## 2023-04-28 NOTE — Therapy (Signed)
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   FH: CAD (coronary artery disease) 12/24/2015   High cholesterol    Migraines    Past Surgical History:  Procedure Laterality Date   HERNIA REPAIR  2005   STRABISMUS SURGERY Bilateral 07/23/2022   Procedure: REPAIR STRABISMUS BILATERAL;  Surgeon: French Ana, MD;  Location:  [facility];  Service: Ophthalmology;  Laterality: Bilateral;   TONSILLECTOMY  1967   WISDOM TOOTH EXTRACTION  1976   Patient Active Problem List   Diagnosis Date Noted   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   Hyperglycemia 11/17/2022   Tinnitus of right ear 10/28/2021   Statin intolerance 10/27/2020   Constipation 10/27/2020   Gynecomastia 09/23/2017   Coronary artery disease due to lipid rich plaque 12/24/2015   Hyperlipidemia LDL goal <100 01/01/2015   Osteopenia 11/23/2013   Benign prostatic hyperplasia 12/15/2012    PCP: Jacquiline Doe  REFERRING PROVIDER: Clementeen Graham   REFERRING DIAG: L shoulder pain   THERAPY DIAG:  Acute pain of left shoulder  Rationale for Evaluation and Treatment: Rehabilitation  ONSET DATE: march 2024  SUBJECTIVE:                                                                                                                                                                                      SUBJECTIVE STATEMENT: Pt feels shoulder was sore while sleeping  this week. Feels a bit better today. .  Eval: Pt states sudden onset of L shoulder pain. Has not had pain previously. He does not recall incident, but does play roller hockey every week. Has been able to continue to play without much pain.  Most pain with Pushing down, forward, taking off sock , reaching behind, out to the side, sleeping on it.  Works from home, desk job.  Hand dominance: Right  PERTINENT HISTORY: CAD   PAIN:  Are you having pain? Yes: NPRS scale: up to 5-8/10 Pain location: L shoulder  Pain description: Sore Aggravating factors: pushing, moving arm up, out to side, behind  Relieving factors: none stated   PRECAUTIONS: None  WEIGHT BEARING RESTRICTIONS: No  FALLS:  Has patient fallen in last 6 months? No   PLOF: Independent  PATIENT GOALS:  Decreased pain with shoulder movements   NEXT MD VISIT:   OBJECTIVE:   DIAGNOSTIC FINDINGS:   PATIENT SURVEYS :   COGNITION: Overall cognitive status: Within functional limits for tasks assessed     SENSATION: WFL  POSTURE:   UPPER EXTREMITY ROM:   Active ROM Right eval Left eval L 04/28/23  Shoulder flexion 150 A A 110/ P 120 120 A / P 128 (135 with cane)   Shoulder extension     Shoulder abduction  A 110/  P 120 A 120  Shoulder adduction     Shoulder internal rotation  Mild limitation behind back with pain.    Shoulder external rotation  Limited behind back, fingers to mid cervical Supine: mild limitation: pain at end range ER: prom: 60   Elbow flexion     Elbow extension     Wrist flexion     Wrist extension     Wrist ulnar deviation     Wrist radial deviation     Wrist pronation     Wrist supination     (Blank rows = not tested)  UPPER EXTREMITY MMT:  MMT Right eval Left eval  Shoulder flexion  3  Shoulder extension    Shoulder abduction  3  Shoulder adduction    Shoulder internal rotation  Mild pain 4  Shoulder external rotation  4-  Middle trapezius    Lower trapezius    Elbow  flexion    Elbow extension    Wrist flexion    Wrist extension    Wrist ulnar deviation    Wrist radial deviation    Wrist pronation    Wrist supination    Grip strength (lbs)    (Blank rows = not tested)   JOINT MOBILITY TESTING:  Hypomobile L  GHJ,  pain at end range for flex, abd, and ER   PALPATION:  Minimal soreness to palpate,  Hypomobile L  GHJ,  pain at end range for flex, abd, and ER     TODAY'S TREATMENT:                                                                                                                                         DATE:   04/28/23: Therapeutic Exercise: Aerobic: Supine:  shoulder AAROM/cane x 15;  Quadruped:  S/L:  abd  x 10,  horiz abd 2 x 10; Shoulder ER  2lb x 10  Seated:  Standing:;   chest pres RTB x 15;  RTB SA press x 15;  IR behind back, series with stick x 10 ea;  Stretches:   Flexion at rail 20 sec  x 3;  ER at table 20 sec x 4;  Neuromuscular Re-education: Manual Therapy:  GHJ mobs post and inf, LAD, PROM for all motions.    04/25/23: Therapeutic Exercise: Aerobic: Supine:  Quadruped:  S/L:  abd 2 x 10,  horiz abd 2 x 10; Shoulder ER  x 10,  2lb x 10  Seated: Shoulder pulley/flexion and abd x 20;   Standing:; shoulder bil ER, RTB x15 ,  Ball rolls for flex/abd x 10 ea;  IR behind back, series with stick x 10 ea;  Stretches:  Neuromuscular Re-education: Manual Therapy:  GHJ mobs post and inf, LAD, PROM for all motions.    04/22/23: Therapeutic Exercise: Aerobic: Supine:  Quadruped:  SA presses x 15;  S/L:  abd 2 x 10,  horiz abd 2 x 10;  Seated: Shoulder pulley/flexion and abd x 20;  lat press into table x 10, 3 sec holds  Standing: Wall slides L only x 15/flexion; shoulder ER, GTB x15 ,  IR GTB x 15;  Stretches:  Neuromuscular Re-education: Manual Therapy:  GHJ mobs post and inf, LAD, PROM for all motions.    04/18/23: Therapeutic Exercise: Aerobic: Supine: Shoulder flexion/cane 5 sec x 10;  shoulder ER/cane x  10;  S/L:  ER  2 x 10, 2lb;  Seated: Shoulder pulley/flexion and abd x 20;   shoulder ER/cane x 15  Standing: Wall slides L only x 15/flexion; shoulder ER, YTB 2 x 10;  Stretches:  Neuromuscular Re-education: Manual Therapy:  GHJ mobs post and inf, LAD, PROM for all motions.    PATIENT EDUCATION:  Education details: updated and reviewed HEP Person educated: Patient Education method: Explanation, Demonstration, Tactile cues, Verbal cues, and Handouts Education comprehension: verbalized understanding, returned demonstration, verbal cues required, tactile cues required, and needs further education   HOME EXERCISE PROGRAM: Access Code: [email or web address]  ASSESSMENT:  CLINICAL IMPRESSION: 04/28/2023 Pt improving slowly, but continues to have joint stiffness with limited ROM for flexion, abd and rotation. He is doing well with muscle activation and light strengthening without pain. Discussed frequent ROM throughout day for HEP this week.    Eval: Patient presents with primary complaint of increased pain in L shoulder. He has joint stiffness that is limiting full shoulder ROM, as well as pain at end ranges for most all motions. He has decreased ability for full functional activities due to pain and deficit, with limited ability for reach, lift, carry, and IADLs. Pt to benefit from skilled PT to improve deficits and pain.    OBJECTIVE IMPAIRMENTS: decreased activity tolerance, decreased mobility, decreased ROM, decreased strength, hypomobility, increased muscle spasms, impaired UE functional use, and pain.   ACTIVITY LIMITATIONS: carrying, lifting, dressing, reach over head, hygiene/grooming, and locomotion level  PARTICIPATION LIMITATIONS: meal prep, cleaning, laundry, and community activity  PERSONAL FACTORS:  none  are also affecting patient's functional outcome.   REHAB POTENTIAL: Good  CLINICAL DECISION MAKING: Stable/uncomplicated  EVALUATION COMPLEXITY: Low  GOALS: Goals reviewed with  patient? Yes  SHORT TERM GOALS: Target date: 04/26/2023    Pt to be independent with initial HEP  Goal status: INITIAL  2.  Pt to demo at least 15 deg improvement in elevation without pain  Goal status: INITIAL     LONG TERM GOALS: Target date: 06/07/2023   Pt to be independent with final HEP  Goal status: INITIAL  2.  Pt to demo improved AROM for L shoulder/all motions  to be [facility] to improve ability for lift, reach, and IADLS.  Goal status: INITIAL  3.  Pt to report decreased pain in L shoulder to 0-2/10 with activity, to improve ability for ADLs .   Goal status: INITIAL    PLAN: PT FREQUENCY: 1-2x/week  PT DURATION: 8 weeks  PLANNED INTERVENTIONS: Therapeutic exercises, Therapeutic activity, Neuromuscular re-education, Patient/Family education, Self Care, Joint mobilization, Joint manipulation, Stair training, Orthotic/Fit training, DME instructions, Aquatic Therapy, Dry Needling, Electrical stimulation, Cryotherapy, Moist heat, Taping, Ultrasound, Ionotophoresis 4mg /ml Dexamethasone, Manual therapy,  Vasopneumatic device,    PLAN FOR NEXT SESSION: PROM, joint mobs, LAD, AAROM, RTC strength , quadruped SA press   Sedalia Muta, PT, DPT 10:05 AM  04/28/23

## 2023-05-02 ENCOUNTER — Encounter: Payer: Self-pay | Admitting: Physical Therapy

## 2023-05-02 ENCOUNTER — Ambulatory Visit (INDEPENDENT_AMBULATORY_CARE_PROVIDER_SITE_OTHER): Payer: 59 | Admitting: Physical Therapy

## 2023-05-02 DIAGNOSIS — M25512 Pain in left shoulder: Secondary | ICD-10-CM | POA: Diagnosis not present

## 2023-05-02 NOTE — Therapy (Signed)
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   FH: CAD (coronary artery disease) 12/24/2015   High cholesterol    Migraines    Past Surgical History:  Procedure Laterality Date   HERNIA REPAIR  2005   STRABISMUS SURGERY Bilateral 07/23/2022   Procedure: REPAIR STRABISMUS BILATERAL;  Surgeon: French Ana, MD;  Location: [facility];  Service: Ophthalmology;  Laterality: Bilateral;   TONSILLECTOMY  1967   WISDOM TOOTH EXTRACTION  1976   Patient Active Problem List   Diagnosis Date Noted   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   Hyperglycemia 11/17/2022   Tinnitus of right ear 10/28/2021   Statin intolerance 10/27/2020   Constipation 10/27/2020   Gynecomastia 09/23/2017   Coronary artery disease due to lipid rich plaque 12/24/2015   Hyperlipidemia LDL goal <100 01/01/2015   Osteopenia 11/23/2013   Benign prostatic hyperplasia 12/15/2012    PCP: Jacquiline Doe  REFERRING PROVIDER: Clementeen Graham   REFERRING DIAG: L shoulder pain   THERAPY DIAG:  Acute pain of left shoulder  Rationale for Evaluation and Treatment: Rehabilitation  ONSET DATE: march 2024  SUBJECTIVE:                                                                                                                                                                                      SUBJECTIVE STATEMENT: Pt feels shoulder doing ok, still sore when  sleeping at times but noticed a bit better with activity .  Eval: Pt states sudden onset of L shoulder pain. Has not had pain previously. He does not recall incident, but does play roller hockey every week. Has been able to continue to play without much pain.  Most pain with Pushing down, forward, taking off sock , reaching behind, out to the side, sleeping on it.  Works from home, desk job.  Hand dominance: Right  PERTINENT HISTORY: CAD   PAIN:  Are you having pain? Yes: NPRS scale: up to 5-8/10  Pain location: L shoulder  Pain description: Sore Aggravating factors: pushing, moving arm up, out to side, behind  Relieving factors: none stated   PRECAUTIONS: None  WEIGHT BEARING RESTRICTIONS: No  FALLS:  Has patient fallen in last 6 months? No   PLOF: Independent  PATIENT GOALS:  Decreased pain with shoulder movements   NEXT MD VISIT:   OBJECTIVE:   DIAGNOSTIC FINDINGS:   PATIENT SURVEYS :   COGNITION: Overall cognitive status: Within functional limits for tasks assessed     SENSATION: WFL  POSTURE:   UPPER EXTREMITY ROM:   Active ROM Right eval Left eval L 04/28/23  Shoulder flexion 150 A A 110/ P 120 120 A / P 128 (135 with cane)   Shoulder extension     Shoulder abduction  A 110/  P 120 A 120  Shoulder adduction     Shoulder internal rotation  Mild limitation behind back with pain.    Shoulder external rotation  Limited behind back, fingers to mid cervical Supine: mild limitation: pain at end range ER: prom: 60   Elbow flexion     Elbow extension     Wrist flexion     Wrist extension     Wrist ulnar deviation     Wrist radial deviation     Wrist pronation     Wrist supination     (Blank rows = not tested)  UPPER EXTREMITY MMT:  MMT Right eval Left eval  Shoulder flexion  3  Shoulder extension    Shoulder abduction  3  Shoulder adduction    Shoulder internal rotation  Mild pain 4  Shoulder external rotation  4-  Middle trapezius    Lower  trapezius    Elbow flexion    Elbow extension    Wrist flexion    Wrist extension    Wrist ulnar deviation    Wrist radial deviation    Wrist pronation    Wrist supination    Grip strength (lbs)    (Blank rows = not tested)   JOINT MOBILITY TESTING:  Hypomobile L  GHJ,  pain at end range for flex, abd, and ER   PALPATION:  Minimal soreness to palpate,  Hypomobile L  GHJ,  pain at end range for flex, abd, and ER     TODAY'S TREATMENT:                                                                                                                                         DATE:   05/02/23: Therapeutic Exercise: Aerobic: Supine:  shoulder AAROM/cane x 15;  Quadruped:    Shoulder ER  2lb 2 x 10  Seated:  Standing:  Rows GTB x 20;   ER rolls at wall, Rtb x 20;   IR/ER RTB 2 x 10 ea;  IR behind back, series with stick x 10 ea;  Flexion arom x  15;   abd to 90 deg (clicking and pain) x 10;  Stretches:   trial for ER at 90 with stick x 10  Neuromuscular Re-education: Manual Therapy:  GHJ mobs post and inf, LAD, PROM for all motions.    04/28/23: Therapeutic Exercise: Aerobic: Supine:  shoulder AAROM/cane x 15;  Quadruped:  S/L:  abd  x 10,  horiz abd 2 x 10; Shoulder ER  2lb x 10  Seated:  Standing:;   chest pres RTB x 15;  RTB SA press x 15;  IR behind back, series with stick x 10 ea;  Stretches:   Flexion at rail 20 sec x 3;  ER at table 20 sec x 4;  Neuromuscular Re-education: Manual Therapy:  GHJ mobs post and inf, LAD, PROM for all motions.    PATIENT EDUCATION:  Education details: updated and reviewed HEP Person educated: Patient Education method: Explanation, Demonstration, Tactile cues, Verbal cues, and Handouts Education comprehension: verbalized understanding, returned demonstration, verbal cues required, tactile cues required, and needs further education   HOME EXERCISE PROGRAM: Access Code: [email or web address]  ASSESSMENT:  CLINICAL IMPRESSION: 05/02/2023 Pt  improving with ability for AROM and strengthening. Still having pain with active abduction. He also has improving PROM, sill with mild stiffness for end range flexion and ER. He is leaving to go out of country next week, will review HEP next visit.   Eval: Patient presents with primary complaint of increased pain in L shoulder. He has joint stiffness that is limiting full shoulder ROM, as well as pain at end ranges for most all motions. He has decreased ability for full functional activities due to pain and deficit, with limited ability for reach, lift, carry, and IADLs. Pt to benefit from skilled PT to improve deficits and pain.    OBJECTIVE IMPAIRMENTS: decreased activity tolerance, decreased mobility, decreased ROM, decreased strength, hypomobility, increased muscle spasms, impaired UE functional use, and pain.   ACTIVITY LIMITATIONS: carrying, lifting, dressing, reach over head, hygiene/grooming, and locomotion level  PARTICIPATION LIMITATIONS: meal prep, cleaning, laundry, and community activity  PERSONAL FACTORS:  none  are also affecting patient's functional outcome.   REHAB POTENTIAL: Good  CLINICAL DECISION MAKING: Stable/uncomplicated  EVALUATION COMPLEXITY: Low  GOALS: Goals reviewed with patient? Yes  SHORT TERM GOALS: Target date: 04/26/2023    Pt to be independent with initial HEP  Goal status: INITIAL  2.  Pt to demo at least 15 deg improvement in elevation without pain  Goal status: INITIAL     LONG TERM GOALS: Target date: 06/07/2023   Pt to be independent with final HEP  Goal status: INITIAL  2.  Pt to demo improved AROM for L shoulder/all motions  to be [facility] to improve ability for lift, reach, and IADLS.   Goal status: INITIAL  3.  Pt to report decreased pain in L shoulder to 0-2/10 with activity, to improve ability for ADLs .   Goal status: INITIAL    PLAN: PT FREQUENCY: 1-2x/week  PT DURATION: 8 weeks  PLANNED INTERVENTIONS: Therapeutic  exercises, Therapeutic activity, Neuromuscular re-education, Patient/Family education, Self Care, Joint mobilization, Joint manipulation, Stair training, Orthotic/Fit training, DME instructions, Aquatic Therapy, Dry Needling, Electrical stimulation, Cryotherapy, Moist heat, Taping, Ultrasound, Ionotophoresis 4mg /ml Dexamethasone, Manual therapy,  Vasopneumatic device,    PLAN FOR NEXT SESSION: PROM, joint mobs, LAD, AAROM, RTC strength , quadruped SA press   Sedalia Muta, PT, DPT 10:21 AM  05/02/23

## 2023-05-05 ENCOUNTER — Encounter: Payer: Self-pay | Admitting: Physical Therapy

## 2023-05-05 ENCOUNTER — Ambulatory Visit (INDEPENDENT_AMBULATORY_CARE_PROVIDER_SITE_OTHER): Payer: 59 | Admitting: Physical Therapy

## 2023-05-05 DIAGNOSIS — M25512 Pain in left shoulder: Secondary | ICD-10-CM | POA: Diagnosis not present

## 2023-05-05 NOTE — Therapy (Addendum)
OUTPATIENT PHYSICAL THERAPY UPPER EXTREMITY TREATMENT   Patient Name: Edward Medina MRN: [id number] DOB:[birth year], 67 y.o., male Today's Date: 05/05/2023  END OF SESSION:  PT End of Session - 05/05/23 0801     Visit Number 8    Number of Visits 16    Date for PT Re-Evaluation 06/07/23    Authorization Type UHC    PT Start Time 0801    PT Stop Time 0844    PT Time Calculation (min) 43 min    Activity Tolerance Patient tolerated treatment well    Behavior During Therapy [facility] for tasks assessed/performed             Past Medical History:  Diagnosis Date   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   FH: CAD (coronary artery disease) 12/24/2015   High cholesterol    Migraines    Past Surgical History:  Procedure Laterality Date   HERNIA REPAIR  2005   STRABISMUS SURGERY Bilateral 07/23/2022   Procedure: REPAIR STRABISMUS BILATERAL;  Surgeon: French Ana, MD;  Location: [facility];  Service: Ophthalmology;  Laterality: Bilateral;   TONSILLECTOMY  1967   WISDOM TOOTH EXTRACTION  1976   Patient Active Problem List   Diagnosis Date Noted   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   Hyperglycemia 11/17/2022   Tinnitus of right ear 10/28/2021   Statin intolerance 10/27/2020   Constipation 10/27/2020   Gynecomastia 09/23/2017   Coronary artery disease due to lipid rich plaque 12/24/2015   Hyperlipidemia LDL goal <100 01/01/2015   Osteopenia 11/23/2013   Benign prostatic hyperplasia 12/15/2012    PCP: Jacquiline Doe  REFERRING PROVIDER: Clementeen Graham   REFERRING DIAG: L shoulder pain   THERAPY DIAG:  Acute pain of left shoulder  Rationale for Evaluation and Treatment: Rehabilitation  ONSET DATE: march 2024  SUBJECTIVE:                                                                                                                                                                                      SUBJECTIVE STATEMENT: Pt states soreness when sleeping last night,  and in the past couple weeks has been more consistently sore with sleep.   Eval: Pt states sudden onset of L shoulder pain. Has not had pain previously. He does not recall incident, but does play roller hockey every week. Has been able to continue to play without much pain.  Most pain with Pushing down, forward, taking off sock , reaching behind, out to the side, sleeping on it.  Works from home, desk job.  Hand dominance: Right  PERTINENT HISTORY: CAD   PAIN:  Are you having pain? Yes: NPRS scale: up  to 5-8/10 Pain location: L shoulder  Pain description: Sore Aggravating factors: pushing, moving arm up, out to side, behind  Relieving factors: none stated   PRECAUTIONS: None  WEIGHT BEARING RESTRICTIONS: No  FALLS:  Has patient fallen in last 6 months? No   PLOF: Independent  PATIENT GOALS:  Decreased pain with shoulder movements   NEXT MD VISIT:   OBJECTIVE:   DIAGNOSTIC FINDINGS:   PATIENT SURVEYS :   COGNITION: Overall cognitive status: Within functional limits for tasks assessed     SENSATION: WFL  POSTURE:   UPPER EXTREMITY ROM:   Active ROM Right eval Left eval L 04/28/23  Shoulder flexion 150 A A 110/ P 120 120 A / P 128 (135 with cane)   Shoulder extension     Shoulder abduction  A 110/  P 120 A 120  Shoulder adduction     Shoulder internal rotation  Mild limitation behind back with pain.    Shoulder external rotation  Limited behind back, fingers to mid cervical Supine: mild limitation: pain at end range ER: prom: 60   Elbow flexion     Elbow extension     Wrist flexion     Wrist extension     Wrist ulnar deviation     Wrist radial deviation     Wrist pronation     Wrist supination     (Blank rows = not tested)  UPPER EXTREMITY MMT:  MMT Right eval Left eval  Shoulder flexion  3  Shoulder extension    Shoulder abduction  3  Shoulder adduction    Shoulder internal rotation  Mild pain 4  Shoulder external rotation  4-  Middle  trapezius    Lower trapezius    Elbow flexion    Elbow extension    Wrist flexion    Wrist extension    Wrist ulnar deviation    Wrist radial deviation    Wrist pronation    Wrist supination    Grip strength (lbs)    (Blank rows = not tested)   JOINT MOBILITY TESTING:  Hypomobile L  GHJ,  pain at end range for flex, abd, and ER   PALPATION:  Minimal soreness to palpate,  Hypomobile L  GHJ,  pain at end range for flex, abd, and ER     TODAY'S TREATMENT:                                                                                                                                         DATE:   05/05/23: Therapeutic Exercise: Aerobic:  UBE x 4 min fwd/bwd  Supine:   shoulder AROM/ x 10;  Chest press 3 lb x 15;  Quadruped:   Seated:  Standing:  Rows GTB x 20;    ER ROM x 15;  ER RTB 2 x 10 ea; ER with abd x 15;  IR behind back,  series with stick x 10 ea;  Flexion arom x 15;  wall angel position/elevation x 10;   Wall push ups 2 x10  Stretches:   Neuromuscular Re-education: Manual Therapy:  GHJ mobs post and inf, LAD, PROM for all motions.    05/02/23: Therapeutic Exercise: Aerobic: Supine:  shoulder AAROM/cane x 15;  Quadruped:    Shoulder ER  2lb 2 x 10  Seated:  Standing:  Rows GTB x 20;   ER rolls at wall, Rtb x 20;   IR/ER RTB 2 x 10 ea;  IR behind back, series with stick x 10 ea;  Flexion arom x 15;   abd to 90 deg (clicking and pain) x 10;  Stretches:   trial for ER at 90 with stick x 10  Neuromuscular Re-education: Manual Therapy:  GHJ mobs post and inf, LAD, PROM for all motions.    04/28/23: Therapeutic Exercise: Aerobic: Supine:  shoulder AAROM/cane x 15;  Quadruped:  S/L:  abd  x 10,  horiz abd 2 x 10; Shoulder ER  2lb x 10  Seated:  Standing:;   chest pres RTB x 15;  RTB SA press x 15;  IR behind back, series with stick x 10 ea;  Stretches:   Flexion at rail 20 sec x 3;  ER at table 20 sec x 4;  Neuromuscular Re-education: Manual Therapy:  GHJ  mobs post and inf, LAD, PROM for all motions.    PATIENT EDUCATION:  Education details: updated and reviewed HEP Person educated: Patient Education method: Explanation, Demonstration, Tactile cues, Verbal cues, and Handouts Education comprehension: verbalized understanding, returned demonstration, verbal cues required, tactile cues required, and needs further education   HOME EXERCISE PROGRAM: Access Code: [email or web address]  ASSESSMENT:  CLINICAL IMPRESSION: 05/05/2023 Pt improving with ability for AROM and strengthening. Still having joint stiffness for flexion and ER , which limits full ROM. He is going away for a few weeks, reviewed HEP to continue while he is away, and he will return when he gets back. Pt will benefit from continued care.  Eval: Patient presents with primary complaint of increased pain in L shoulder. He has joint stiffness that is limiting full shoulder ROM, as well as pain at end ranges for most all motions. He has decreased ability for full functional activities due to pain and deficit, with limited ability for reach, lift, carry, and IADLs. Pt to benefit from skilled PT to improve deficits and pain.    OBJECTIVE IMPAIRMENTS: decreased activity tolerance, decreased mobility, decreased ROM, decreased strength, hypomobility, increased muscle spasms, impaired UE functional use, and pain.   ACTIVITY LIMITATIONS: carrying, lifting, dressing, reach over head, hygiene/grooming, and locomotion level  PARTICIPATION LIMITATIONS: meal prep, cleaning, laundry, and community activity  PERSONAL FACTORS:  none  are also affecting patient's functional outcome.   REHAB POTENTIAL: Good  CLINICAL DECISION MAKING: Stable/uncomplicated  EVALUATION COMPLEXITY: Low  GOALS: Goals reviewed with patient? Yes  SHORT TERM GOALS: Target date: 04/26/2023    Pt to be independent with initial HEP  Goal status: MET  2.  Pt to demo at least 15 deg improvement in elevation without pain  Goal  status: MET     LONG TERM GOALS: Target date: 06/07/2023   Pt to be independent with final HEP  Goal status: MET  2.  Pt to demo improved AROM for L shoulder/all motions  to be [facility] to improve ability for lift, reach, and IADLS.   Goal status: IN PROGRESS  3.  Pt to report  decreased pain in L shoulder to 0-2/10 with activity, to improve ability for ADLs .   Goal status: MET    PLAN: PT FREQUENCY: 1-2x/week  PT DURATION: 8 weeks  PLANNED INTERVENTIONS: Therapeutic exercises, Therapeutic activity, Neuromuscular re-education, Patient/Family education, Self Care, Joint mobilization, Joint manipulation, Stair training, Orthotic/Fit training, DME instructions, Aquatic Therapy, Dry Needling, Electrical stimulation, Cryotherapy, Moist heat, Taping, Ultrasound, Ionotophoresis 4mg /ml Dexamethasone, Manual therapy,  Vasopneumatic device,    PLAN FOR NEXT SESSION: PROM, joint mobs, LAD, AAROM, RTC strength , quadruped SA press   Sedalia Muta, PT, DPT 8:44 AM  05/05/23   PHYSICAL THERAPY DISCHARGE SUMMARY  Visits from Start of Care:8 Plan: Patient agrees to discharge.  Patient goals were met. Patient is being discharged due to meeting the stated rehab goals.    Pt cancelled last appt, spoke with him on phone, he feels he is doing very well and can continue HEP on his own.    Sedalia Muta, PT, DPT 3:02 PM  06/06/23

## 2023-05-06 NOTE — Progress Notes (Unsigned)
   Rubin Payor, PhD, LAT, ATC acting as a scribe for Clementeen Graham, MD.  Edward Medina is a 67 y.o. male who presents to [facility] today for 6-wk f/u L shoulder pain. 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 Pt was last seen by Dr. Denyse Amass on 03/28/23 and discussed NSAIDs dosage and was referred to PT, completing 8 visits. Today, pt reports L shoulder progress is slow, but getting somewhat better. Not more than 50% improvement. He notes cont'd pain and limitation w/ AROM.  Dx imaging: 03/28/23 L shoulder XR  Pertinent review of systems: No fevers or chills  Relevant historical information: History of an acoustic neuroma.   Exam:  BP 104/78   Pulse 66   Ht 5\' 9"  (1.753 m)   Wt 171 lb (77.6 kg)   SpO2 96%   BMI 25.25 kg/m  General: Well Developed, well nourished, and in no acute distress.   MSK: Left shoulder normal appearing Limited range of motion.  Abduction limited to 130 degrees.  Functional internal rotation lumbar spine external rotation 30 degrees beyond neutral position.    Lab and Radiology Results  EXAM: LEFT SHOULDER - 2+ VIEW   COMPARISON:  None Available.   FINDINGS: There is no evidence of fracture or dislocation. There is no evidence of arthropathy or other focal bone abnormality. Soft tissues are unremarkable.   IMPRESSION: Negative.     Electronically Signed   By: Gerome Sam III M.D.   On: 03/28/2023 18:18 I, Clementeen Graham, personally (independently) visualized and performed the interpretation of the images attached in this note.      Assessment and Plan: 67 y.o. male with chronic left shoulder pain.  Pain thought to be due to rotator cuff tendinopathy or perhaps adhesive capsulitis.  He is improving with PT but not fully resolved.  Plan to give the PT and home exercises a little more time.  If not improved in a month next steps would be either injection or MRI.  Personally I favor an MRI.   He can call and I can schedule an MRI if needed.  He will keep me updated with how he is feeling. He is traveling to [place] this week will be gone for about 2 weeks.  PDMP not reviewed this encounter. No orders of the defined types were placed in this encounter.  No orders of the defined types were placed in this encounter.    Discussed warning signs or symptoms. Please see discharge instructions. Patient expresses understanding.   The above documentation has been reviewed and is accurate and complete Clementeen Graham, M.D.

## 2023-05-09 ENCOUNTER — Ambulatory Visit (INDEPENDENT_AMBULATORY_CARE_PROVIDER_SITE_OTHER): Payer: 59 | Admitting: Family Medicine

## 2023-05-09 VITALS — BP 104/78 | HR 66 | Ht 69.0 in | Wt 171.0 lb

## 2023-05-09 DIAGNOSIS — M25512 Pain in left shoulder: Secondary | ICD-10-CM | POA: Diagnosis not present

## 2023-05-09 DIAGNOSIS — G8929 Other chronic pain: Secondary | ICD-10-CM | POA: Diagnosis not present

## 2023-05-09 NOTE — Patient Instructions (Signed)
Thank you for coming in today.   Plan to continue home exercises and PT.   I think giving it another month is a good idea.   If not better next steps are either MRI or injection.   I can order an MRI with a Mychart message or a phone call.

## 2023-05-23 NOTE — Research (Signed)
IP Dispensation: Date dispensed 04/08/2023 Number dispensed- total: 8 Number dispensed home to patient: 7 Box #: [id number] Box #: [id number]   IP Administration in Clinic: Box #: [id number] Administration time: 0905 [x] AM [] PM in clinic Quantity Administered:   [] None [] Partial [x] Full Reason for None or Partial Dose ____ Administration Site [x] Abdomen [] Arm [] Thigh Laterality [x] Left  [] Right Administered by [x] Patient [] Caregiver

## 2023-05-23 NOTE — Research (Signed)
IP Dispensation: Date dispensed 12/17/2022 Number dispensed- total: 8 Number dispensed home to patient: 7 Box #: [id number] Box #: [id number]   IP Administration in Clinic: Box #: [id number] Administration time: 0940 [x] AM [] PM in clinic Quantity Administered:   [] None [] Partial [x] Full Reason for None or Partial Dose ____ Administration Site [x] Abdomen [] Arm [] Thigh Laterality [x] Left  [] Right Administered by [x] Patient [] Caregiver

## 2023-06-03 ENCOUNTER — Encounter: Payer: 59 | Admitting: Physical Therapy

## 2023-06-06 ENCOUNTER — Encounter: Payer: 59 | Admitting: Physical Therapy

## 2023-06-08 ENCOUNTER — Ambulatory Visit
Admission: RE | Admit: 2023-06-08 | Discharge: 2023-06-08 | Disposition: A | Discharge status: Home / Self Care | Payer: 59 | Source: Ambulatory Visit | Attending: Otolaryngology | Admitting: Otolaryngology

## 2023-06-08 DIAGNOSIS — H9190 Unspecified hearing loss, unspecified ear: Secondary | ICD-10-CM

## 2023-06-08 MED ORDER — GADOPICLENOL 0.5 MMOL/ML IV SOLN
8.0000 mL | Freq: Once | INTRAVENOUS | Status: AC | PRN
  Administered 2023-06-08: 8 mL via INTRAVENOUS

## 2023-06-09 ENCOUNTER — Encounter: Payer: 59 | Admitting: Physical Therapy

## 2023-06-13 ENCOUNTER — Encounter: Payer: 59 | Admitting: Physical Therapy

## 2023-06-16 ENCOUNTER — Encounter: Payer: 59 | Admitting: Physical Therapy

## 2023-07-29 ENCOUNTER — Encounter: Payer: 59 | Admitting: *Deleted

## 2023-07-29 DIAGNOSIS — Z006 Encounter for examination for normal comparison and control in clinical research program: Secondary | ICD-10-CM

## 2023-07-29 MED ORDER — STUDY - VESALIUS - EVOLOCUMAB (AMG 145) 140 MG/ML OR PLACEBO SQ INJECTION (PI-HILTY)
140.0000 mg | INJECTION | SUBCUTANEOUS | Status: DC
  Administered 2023-07-29: 140 mg via SUBCUTANEOUS
  Filled 2023-07-29: qty 1

## 2023-07-29 MED ORDER — STUDY - VESALIUS - EVOLOCUMAB (AMG 145) 140 MG/ML OR PLACEBO SQ INJECTION (PI-HILTY): 140.0000 mg | INJECTION | SUBCUTANEOUS | 0 refills | Status: DC

## 2023-07-29 NOTE — Research (Addendum)
[facility] week 176  No chest pains/shortness of breath No med changes   Vitals: [x]  Experience any AE/SAE/Hospitalizations [x]   If yes please explain:   Non-Fatal Potential Endpoint Assessment Yes  No   Has the subject experienced/undergone any of the following since the last visit/contact?   []   [x]    Any Coronary Artery Revascularization/Cerebrovascular Revascularization/ Peripheral Artery Revascularization/Amputation Procedure   []   [x]    Myocardial Infarction []   [x]    Stroke   []   [x]    Provide the date for the non-fatal Potential Endpoints status:   []   [x]    IP admin please see MAR (please add Box # to comment section on MAR) [x]   Patient given: 8 dispense 7 dispense home to with patient  [id number] - 3 injectors sent home with patient  [id number] - full box - 4  Patient brought back: [id number] and [id number] - all injectors have been used   Will see patient back Week 192 - 11/18/2023

## 2023-08-10 ENCOUNTER — Other Ambulatory Visit: Payer: Self-pay | Admitting: Urology

## 2023-08-10 DIAGNOSIS — R972 Elevated prostate specific antigen [PSA]: Secondary | ICD-10-CM

## 2023-09-26 ENCOUNTER — Ambulatory Visit
Admission: RE | Admit: 2023-09-26 | Discharge: 2023-09-26 | Disposition: A | Discharge status: Home / Self Care | Payer: 59 | Source: Ambulatory Visit | Attending: Urology | Admitting: Urology

## 2023-09-26 DIAGNOSIS — R972 Elevated prostate specific antigen [PSA]: Secondary | ICD-10-CM

## 2023-09-26 MED ORDER — GADOPICLENOL 0.5 MMOL/ML IV SOLN
8.0000 mL | Freq: Once | INTRAVENOUS | Status: AC | PRN
  Administered 2023-09-26: 8 mL via INTRAVENOUS

## 2023-11-21 ENCOUNTER — Telehealth (HOSPITAL_BASED_OUTPATIENT_CLINIC_OR_DEPARTMENT_OTHER): Payer: Self-pay | Admitting: Internal Medicine

## 2023-11-21 ENCOUNTER — Encounter: Payer: Self-pay | Admitting: Internal Medicine

## 2023-11-21 NOTE — Telephone Encounter (Signed)
Patient would like to switch from Dr. Lynnette Caffey to Dr. Duke Salvia due to location and his wife see's Dr. Duke Salvia. Please advise.  Dr. Lynnette Caffey has already approved switch.

## 2023-11-22 ENCOUNTER — Encounter: Payer: 59 | Admitting: *Deleted

## 2023-11-22 DIAGNOSIS — Z006 Encounter for examination for normal comparison and control in clinical research program: Secondary | ICD-10-CM

## 2023-11-22 MED ORDER — STUDY - VESALIUS - EVOLOCUMAB (AMG 145) 140 MG/ML OR PLACEBO SQ INJECTION (PI-HILTY): 140.0000 mg | INJECTION | SUBCUTANEOUS | 0 refills | Status: DC

## 2023-11-22 MED ORDER — STUDY - VESALIUS - EVOLOCUMAB (AMG 145) 140 MG/ML OR PLACEBO SQ INJECTION (PI-HILTY)
140.0000 mg | INJECTION | SUBCUTANEOUS | Status: DC
  Administered 2023-11-22: 140 mg via SUBCUTANEOUS
  Filled 2023-11-22: qty 1

## 2023-11-22 NOTE — Telephone Encounter (Signed)
Edward Si, MD  to Edward Medina  Me     11/22/23  1:48 PM OK with me.  TCR

## 2023-11-22 NOTE — Research (Signed)
[facility] week 192 Vitals: [x]  Experience any AE/SAE/Hospitalizations [x]   If yes please explain:   PG labs drawn - 0844  Non-Fatal Potential Endpoint Assessment Yes  No   Has the subject experienced/undergone any of the following since the last visit/contact?   []   [x]    Any Coronary Artery Revascularization/Cerebrovascular Revascularization/ Peripheral Artery Revascularization/Amputation Procedure   []   [x]    Myocardial Infarction []   [x]    Stroke   []   [x]    Provide the date for the non-fatal Potential Endpoints status:   []   [x]    IP admin please see MAR (please add Box # to comment section on MAR) [x]   Patient returned: BOX: [id number] BOX: [id number] = 4  Patient given: Box #: [id number] Box #: [id number] = 3   Current Outpatient Medications:    aluminum chloride (DRYSOL) 20 % external solution, Apply topically at bedtime., Disp: , Rfl:    aspirin 81 MG tablet, Take 81 mg by mouth daily., Disp: , Rfl:    Cholecalciferol (VITAMIN D3) 50 MCG (2000 UT) TABS, Take 1 tablet by mouth daily. , Disp: , Rfl:    COLLAGEN PO, Adds as a daily supplement to coffee, Disp: , Rfl:    finasteride (PROSCAR) 5 MG tablet, Take 5 mg by mouth daily., Disp: , Rfl:    fish oil-omega-3 fatty acids 1000 MG capsule, Take 2 g by mouth daily. , Disp: , Rfl:    Multiple Vitamin (MULTI VITAMIN MENS PO), Take by mouth daily., Disp: , Rfl:    Study - [facility] - evolocumab (AMG 145) 140 mg/mL or placebo SQ injection (PI-Hilty), Inject 1 mL (140 mg total) into the skin every 14 (fourteen) days. For Investigational Use Only. Inject 1 mL (contents of one pen) subcutaneously every 2 weeks. Return kits at next visit., Disp: 8 mL, Rfl: 0   terazosin (HYTRIN) 10 MG capsule, Take 5 mg by mouth every other day., Disp: , Rfl:   Current Facility-Administered Medications:    Study - [facility] - evolocumab (AMG 145) 140 mg/mL or placebo SQ injection (PI-Hilty), 140 mg, Subcutaneous, Q14 Days,

## 2023-11-23 ENCOUNTER — Encounter: Payer: Self-pay | Admitting: Family Medicine

## 2023-11-23 ENCOUNTER — Ambulatory Visit (INDEPENDENT_AMBULATORY_CARE_PROVIDER_SITE_OTHER): Payer: 59 | Admitting: Family Medicine

## 2023-11-23 VITALS — BP 117/77 | HR 59 | Temp 98.2°F | Ht 69.0 in | Wt 174.6 lb

## 2023-11-23 DIAGNOSIS — I2510 Atherosclerotic heart disease of native coronary artery without angina pectoris: Secondary | ICD-10-CM

## 2023-11-23 DIAGNOSIS — E785 Hyperlipidemia, unspecified: Secondary | ICD-10-CM | POA: Diagnosis not present

## 2023-11-23 DIAGNOSIS — G4762 Sleep related leg cramps: Secondary | ICD-10-CM

## 2023-11-23 DIAGNOSIS — D333 Benign neoplasm of cranial nerves: Secondary | ICD-10-CM | POA: Diagnosis not present

## 2023-11-23 DIAGNOSIS — R739 Hyperglycemia, unspecified: Secondary | ICD-10-CM | POA: Diagnosis not present

## 2023-11-23 DIAGNOSIS — I2583 Coronary atherosclerosis due to lipid rich plaque: Secondary | ICD-10-CM

## 2023-11-23 DIAGNOSIS — N401 Enlarged prostate with lower urinary tract symptoms: Secondary | ICD-10-CM

## 2023-11-23 DIAGNOSIS — H9311 Tinnitus, right ear: Secondary | ICD-10-CM

## 2023-11-23 DIAGNOSIS — Z0001 Encounter for general adult medical examination with abnormal findings: Secondary | ICD-10-CM | POA: Diagnosis not present

## 2023-11-23 DIAGNOSIS — R351 Nocturia: Secondary | ICD-10-CM

## 2023-11-23 LAB — LIPID PANEL
Cholesterol: 152 mg/dL (ref 0–200)
HDL: 53.3 mg/dL (ref >39.00)
LDL Cholesterol: 82 mg/dL (ref 0–99)
NonHDL: 98.97
Total CHOL/HDL Ratio: 3
Triglycerides: 87 mg/dL (ref 0.0–149.0)
VLDL: 17.4 mg/dL (ref 0.0–40.0)

## 2023-11-23 LAB — TSH: TSH: 1.26 u[IU]/mL (ref 0.35–5.50)

## 2023-11-23 LAB — COMPREHENSIVE METABOLIC PANEL
ALT: 46 U/L (ref 0–53)
AST: 30 U/L (ref 0–37)
Albumin: 4.2 g/dL (ref 3.5–5.2)
Alkaline Phosphatase: 87 U/L (ref 39–117)
BUN: 20 mg/dL (ref 6–23)
CO2: 29 meq/L (ref 19–32)
Calcium: 9.3 mg/dL (ref 8.4–10.5)
Chloride: 103 meq/L (ref 96–112)
Creatinine, Ser: 0.97 mg/dL (ref 0.40–1.50)
GFR: 82.25 mL/min (ref >60.00)
Glucose, Bld: 90 mg/dL (ref 70–99)
Potassium: 4.2 meq/L (ref 3.5–5.1)
Sodium: 138 meq/L (ref 135–145)
Total Bilirubin: 0.8 mg/dL (ref 0.2–1.2)
Total Protein: 7.2 g/dL (ref 6.0–8.3)

## 2023-11-23 LAB — MAGNESIUM: Magnesium: 1.9 mg/dL (ref 1.5–2.5)

## 2023-11-23 LAB — HEMOGLOBIN A1C: Hgb A1c MFr Bld: 5.8 % (ref 4.6–6.5)

## 2023-11-23 NOTE — Assessment & Plan Note (Signed)
Following with urology.  On finasteride 5 mg daily.  On terazosin 5 mg every other day but is weaning down on this.

## 2023-11-23 NOTE — Assessment & Plan Note (Signed)
Following with neurosurgery.  Continuing with watchful waiting for now.

## 2023-11-23 NOTE — Assessment & Plan Note (Signed)
Check A1c. 

## 2023-11-23 NOTE — Patient Instructions (Addendum)
It was very nice to see you today!  We will check blood work today.  Please keep up the great work with your diet and exercise.  Please try stretching your legs at night.  Also try and tucking your sheets.  You can take a B complex and vitamin E supplement to see if this helps with your symptoms as well.  No red flags  Return in about 1 year (around 11/22/2024) for Annual Physical.   Take care, Dr Jimmey Ralph  PLEASE NOTE:  If you had any lab tests, please let us know if you have not heard back within a few days. You may see your results on mychart before we have a chance to review them but we will give you a call once they are reviewed by [place].   If we ordered any referrals today, please let us know if you have not heard from their office within the next week.   If you had any urgent prescriptions sent in today, please check with the pharmacy within an hour of our visit to make sure the prescription was transmitted appropriately.   Please try these tips to maintain a healthy lifestyle:  Eat at least 3 REAL meals and 1-2 snacks per day.  Aim for no more than 5 hours between eating.  If you eat breakfast, please do so within one hour of getting up.   Each meal should contain half fruits/vegetables, one quarter protein, and one quarter carbs (no bigger than a computer mouse)  Cut down on sweet beverages. This includes juice, soda, and sweet tea.   Drink at least 1 glass of water with each meal and aim for at least 8 glasses per day  Exercise at least 150 minutes every week.    Preventive Care 16-6 Years Old, Male Preventive care refers to lifestyle choices and visits with your health care provider that can promote health and wellness. Preventive care visits are also called wellness exams. What can I expect for my preventive care visit? Counseling During your preventive care visit, your health care provider may ask about your: Medical history, including: Past medical problems. Family  medical history. Current health, including: Emotional well-being. Home life and relationship well-being. Sexual activity. Lifestyle, including: Alcohol, nicotine or tobacco, and drug use. Access to firearms. Diet, exercise, and sleep habits. Safety issues such as seatbelt and bike helmet use. Sunscreen use. Work and work Astronomer. Physical exam Your health care provider will check your: Height and weight. These may be used to calculate your BMI (body mass index). BMI is a measurement that tells if you are at a healthy weight. Waist circumference. This measures the distance around your waistline. This measurement also tells if you are at a healthy weight and may help predict your risk of certain diseases, such as type 2 diabetes and high blood pressure. Heart rate and blood pressure. Body temperature. Skin for abnormal spots. What immunizations do I need?  Vaccines are usually given at various ages, according to a schedule. Your health care provider will recommend vaccines for you based on your age, medical history, and lifestyle or other factors, such as travel or where you work. What tests do I need? Screening Your health care provider may recommend screening tests for certain conditions. This may include: Lipid and cholesterol levels. Diabetes screening. This is done by checking your blood sugar (glucose) after you have not eaten for a while (fasting). Hepatitis B test. Hepatitis C test. HIV (human immunodeficiency virus) test. STI (sexually transmitted  infection) testing, if you are at risk. Lung cancer screening. Prostate cancer screening. Colorectal cancer screening. Talk with your health care provider about your test results, treatment options, and if necessary, the need for more tests. Follow these instructions at home: Eating and drinking  Eat a diet that includes fresh fruits and vegetables, whole grains, lean protein, and low-fat dairy products. Take vitamin and  mineral supplements as recommended by your health care provider. Do not drink alcohol if your health care provider tells you not to drink. If you drink alcohol: Limit how much you have to 0-2 drinks a day. Know how much alcohol is in your drink. In the [place] one drink equals one 12 oz bottle of beer (355 mL), one 5 oz glass of wine (148 mL), or one 1 oz glass of hard liquor (44 mL). Lifestyle Brush your teeth every morning and night with fluoride toothpaste. Floss one time each day. Exercise for at least 30 minutes 5 or more days each week. Do not use any products that contain nicotine or tobacco. These products include cigarettes, chewing tobacco, and vaping devices, such as e-cigarettes. If you need help quitting, ask your health care provider. Do not use drugs. If you are sexually active, practice safe sex. Use a condom or other form of protection to prevent STIs. Take aspirin only as told by your health care provider. Make sure that you understand how much to take and what form to take. Work with your health care provider to find out whether it is safe and beneficial for you to take aspirin daily. Find healthy ways to manage stress, such as: Meditation, yoga, or listening to music. Journaling. Talking to a trusted person. Spending time with friends and family. Minimize exposure to UV radiation to reduce your risk of skin cancer. Safety Always wear your seat belt while driving or riding in a vehicle. Do not drive: If you have been drinking alcohol. Do not ride with someone who has been drinking. When you are tired or distracted. While texting. If you have been using any mind-altering substances or drugs. Wear a helmet and other protective equipment during sports activities. If you have firearms in your house, make sure you follow all gun safety procedures. What's next? Go to your health care provider once a year for an annual wellness visit. Ask your health care provider how often  you should have your eyes and teeth checked. Stay up to date on all vaccines. This information is not intended to replace advice given to you by your health care provider. Make sure you discuss any questions you have with your health care provider. Document Revised: 06/03/2021 Document Reviewed: 06/03/2021 Elsevier Patient Education  2024 [employer].

## 2023-11-23 NOTE — Progress Notes (Signed)
Chief Complaint:  Edward Medina is a 67 y.o. male who presents today for his annual comprehensive physical exam.    Assessment/Plan:  New/Acute Problems: Nocturnal Leg Cramps No red flags.  We discussed importance of good hydration.  Also discussed conservative measures including stretching before bed and also untucking to the sheets at the foot of his bed.  He can try B complex and vitamin E supplements.  Will check labs today.  Chronic Problems Addressed Today: Hyperglycemia Check A1c.   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  Continuing with watchful waiting for now.  Hyperlipidemia LDL goal <100 Check lipids.  He is currently on Repatha trial though study is double blinded and he is not sure if he is on treatment or placebo arm.  Benign prostatic hyperplasia Following with urology.  On finasteride 5 mg daily.  On terazosin 5 mg every other day but is weaning down on this.  Coronary artery disease due to lipid rich plaque Following with cardiology.  On aspirin and enrolled in Repatha trial.  Preventative Healthcare: Up-to-date on vaccines and screenings.  Check labs today.  Patient Counseling(The following topics were reviewed and/or handout was given):  -Nutrition: Stressed importance of moderation in sodium/caffeine intake, saturated fat and cholesterol, caloric balance, sufficient intake of fresh fruits, vegetables, and fiber.  -Stressed the importance of regular exercise.   -Substance Abuse: Discussed cessation/primary prevention of tobacco, alcohol, or other drug use; driving or other dangerous activities under the influence; availability of treatment for abuse.   -Injury prevention: Discussed safety belts, safety helmets, smoke detector, smoking near bedding or upholstery.   -Sexuality: Discussed sexually transmitted diseases, partner selection, use of condoms, avoidance of unintended pregnancy and contraceptive alternatives.   -Dental health: Discussed  importance of regular tooth brushing, flossing, and dental visits.  -Health maintenance and immunizations reviewed. Please refer to Health maintenance section.  Return to care in 1 year for next preventative visit.     Subjective:  HPI:  He has no acute complaints today.  See Assessment / plan for status of chronic conditions. Since our last visit he did see neurosurgery for his acoustic neuroma. They are holding off on further intervention for now however are continuing with watchful waiting.   He is sometimes getting nocturnal leg cramps. Located in both of his calves and feet. This has been going on for a couple of years. Happens once every 2-3 weeks.   Lifestyle Diet: Balanced. Plenty of fruits and vegetables. Plenty of greens.  Exercise: Playing roller hockey weekly and working on treadmill routinely.      11/23/2023    7:46 AM  Depression screen PHQ 2/9  Decreased Interest 0  Down, Depressed, Hopeless 0  PHQ - 2 Score 0    There are no preventive care reminders to display for this patient.   ROS: Per HPI, otherwise a complete review of systems was negative.   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   Constipation 10/27/2020   Gynecomastia 09/23/2017   Coronary artery disease due to lipid rich plaque 12/24/2015   Hyperlipidemia LDL goal <100 01/01/2015   Osteopenia  11/23/2013   Benign prostatic hyperplasia 12/15/2012   Past Surgical History:  Procedure Laterality Date   HERNIA REPAIR  2005   STRABISMUS SURGERY Bilateral 07/23/2022   Procedure: REPAIR STRABISMUS BILATERAL;  Surgeon: French Ana, MD;   Location: [facility];  Service: Ophthalmology;  Laterality: Bilateral;   TONSILLECTOMY  1967   WISDOM TOOTH EXTRACTION  1976    Family History  Problem Relation Age of Onset   Heart disease Father    Hyperlipidemia Father    Heart attack Father    Stroke Maternal Grandmother    Heart disease Maternal Grandfather     Medications- reviewed and updated Current Outpatient Medications  Medication Sig Dispense Refill   aluminum chloride (DRYSOL) 20 % external solution Apply topically at bedtime.     aspirin 81 MG tablet Take 81 mg by mouth daily.     Cholecalciferol (VITAMIN D3) 50 MCG (2000 UT) TABS Take 1 tablet by mouth daily.      COLLAGEN PO Adds as a daily supplement to coffee     finasteride (PROSCAR) 5 MG tablet Take 5 mg by mouth daily.     fish oil-omega-3 fatty acids 1000 MG capsule Take 2 g by mouth daily.      Multiple Vitamin (MULTI VITAMIN MENS PO) Take by mouth daily.     Study - [facility] - evolocumab (AMG 145) 140 mg/mL or placebo SQ injection (PI-Hilty) Inject 1 mL (140 mg total) into the skin every 14 (fourteen) days. For Investigational Use Only. Inject 1 mL (contents of one pen) subcutaneously every 2 weeks. Return kits at next visit. 8 mL 0   terazosin (HYTRIN) 10 MG capsule Take 5 mg by mouth every other day.     Current Facility-Administered Medications  Medication Dose Route Frequency Provider Last Rate Last Admin   Study - VESALIUS - evolocumab (AMG 145) 140 mg/mL or placebo SQ injection (PI-Hilty)  140 mg Subcutaneous Q14 Days    140 mg at 11/22/23 0934    Allergies-reviewed and updated Allergies  Allergen Reactions   Atorvastatin Other (See Comments)    Memory impairment   Pravastatin Other (See Comments)    Memory loss/impairment   Simvastatin Other (See Comments)    MADE HIM FEEL DISORIENTED   Amoxicillin Rash    SEVERE    Social History   Socioeconomic History   Marital status: Married    Spouse name: Not on file   Number of  children: 2   Years of education: Not on file   Highest education level: Not on file  Occupational History   Occupation: IT professional  Tobacco Use   Smoking status: Never   Smokeless tobacco: Never  Vaping Use   Vaping status: Never Used  Substance and Sexual Activity   Alcohol use: Yes    Alcohol/week: 2.0 standard drinks of alcohol    Types: 1 Glasses of wine, 1 Cans of beer per week    Comment: drink - beer, wine, and hard liquor-occassionally   Drug use: No   Sexual activity: Yes    Partners: Female    Comment: number of sex partners in the last 12 months - 1, birth control method  (pill)  Other Topics Concern   Not on file  Social History Narrative   Exercise indoor roller hockey 1-2 times per week for 2 hours   College   Married   Chief Executive Officer         Social Determinants of Health  Financial Resource Strain: Not on file  Food Insecurity: Not on file  Transportation Needs: Not on file  Physical Activity: Not on file  Stress: Not on file  Social Connections: Not on file        Objective:  Physical Exam: BP 117/77   Pulse (!) 59   Temp 98.2 F (36.8 C) (Temporal)   Ht 5\' 9"  (1.753 m)   Wt 174 lb 9.6 oz (79.2 kg)   SpO2 98%   BMI 25.78 kg/m   Body mass index is 25.78 kg/m. Wt Readings from Last 3 Encounters:  11/23/23 174 lb 9.6 oz (79.2 kg)  11/22/23 175 lb (79.4 kg)  07/29/23 170 lb (77.1 kg)   Gen: NAD, resting comfortably HEENT: TMs normal bilaterally. OP clear. No thyromegaly noted.  CV: RRR with no murmurs appreciated Pulm: NWOB, CTAB with no crackles, wheezes, or rhonchi GI: Normal bowel sounds present. Soft, Nontender, Nondistended. MSK: no edema, cyanosis, or clubbing noted Skin: warm, dry Neuro: CN2-12 grossly intact. Strength 5/5 in upper and lower extremities. Reflexes symmetric and intact bilaterally.  Psych: Normal affect and thought content     Tynetta Bachmann M. Jimmey Ralph, MD 11/23/2023 8:36 AM

## 2023-11-23 NOTE — Assessment & Plan Note (Signed)
Following with cardiology.  On aspirin and enrolled in Repatha trial.

## 2023-11-23 NOTE — Assessment & Plan Note (Signed)
Check lipids.  He is currently on Repatha trial though study is double blinded and he is not sure if he is on treatment or placebo arm.

## 2023-11-25 ENCOUNTER — Other Ambulatory Visit (INDEPENDENT_AMBULATORY_CARE_PROVIDER_SITE_OTHER): Payer: 59

## 2023-11-25 DIAGNOSIS — E785 Hyperlipidemia, unspecified: Secondary | ICD-10-CM

## 2023-11-25 DIAGNOSIS — Z0001 Encounter for general adult medical examination with abnormal findings: Secondary | ICD-10-CM

## 2023-11-25 NOTE — Addendum Note (Signed)
Addended by: Dyann Kief on: 11/25/2023 02:23 PM   Modules accepted: Orders

## 2023-11-25 NOTE — Progress Notes (Signed)
Can we check with lab regarding his CBC?  The rest of his labs that are back are all stable.  Cholesterol is at goal.  Blood sugar borderline but stable.  Do not need to make any changes to his treatment plan at this time.  We can recheck all this again in a year.

## 2023-11-27 LAB — CBC
HCT: 47 % (ref 39.0–52.0)
Hemoglobin: 15.7 g/dL (ref 13.0–17.0)
MCHC: 33.4 g/dL (ref 30.0–36.0)
MCV: 90.2 fL (ref 78.0–100.0)
Platelets: 163 10*3/uL (ref 150.0–400.0)
RBC: 5.21 Mil/uL (ref 4.22–5.81)
RDW: 13 % (ref 11.5–15.5)
WBC: 8.1 10*3/uL (ref 4.0–10.5)

## 2023-11-28 NOTE — Progress Notes (Signed)
Blood counts are all normal.  We can recheck next year.

## 2024-03-09 ENCOUNTER — Encounter: Admitting: *Deleted

## 2024-03-09 DIAGNOSIS — Z006 Encounter for examination for normal comparison and control in clinical research program: Secondary | ICD-10-CM

## 2024-03-09 MED ORDER — STUDY - VESALIUS - EVOLOCUMAB (AMG 145) 140 MG/ML OR PLACEBO SQ INJECTION (PI-HILTY): 140.0000 mg | INJECTION | SUBCUTANEOUS | 0 refills | Status: DC

## 2024-03-09 MED ORDER — STUDY - VESALIUS - EVOLOCUMAB (AMG 145) 140 MG/ML OR PLACEBO SQ INJECTION (PI-HILTY)
140.0000 mg | INJECTION | SUBCUTANEOUS | Status: AC
Start: 2024-03-09 — End: 2024-03-09
  Administered 2024-03-09: 140 mg via SUBCUTANEOUS
  Filled 2024-03-09: qty 1

## 2024-03-09 NOTE — Research (Signed)
 Week 208 [facility]  Vitals: [x]  Experience any AE/SAE/Hospitalizations [x]   If yes please explain:   Non-Fatal Potential Endpoint Assessment Yes  No   Has the subject experienced/undergone any of the following since the last visit/contact?   []   [x]    Any Coronary Artery Revascularization/Cerebrovascular Revascularization/ Peripheral Artery Revascularization/Amputation Procedure   []   [x]    Myocardial Infarction []   [x]    Stroke   []   [x]    Provide the date for the non-fatal Potential Endpoints status:   []   [x]    IP admin please see MAR (please add Box # to comment section on [facility]) [x]   Patient given: Box # [id number]  subject gave his IP injection in clinic out of this box  Box # [id number]   Subject brought back all his used IP  [id number] Box's returned to pharm of used IP    Current Outpatient Medications:    aluminum chloride (DRYSOL) 20 % external solution, Apply topically at bedtime., Disp: , Rfl:    aspirin 81 MG tablet, Take 81 mg by mouth daily., Disp: , Rfl:    Cholecalciferol (VITAMIN D3) 50 MCG (2000 UT) TABS, Take 1 tablet by mouth daily. , Disp: , Rfl:    COLLAGEN PO, Adds as a daily supplement to coffee, Disp: , Rfl:    finasteride (PROSCAR) 5 MG tablet, Take 5 mg by mouth daily., Disp: , Rfl:    fish oil-omega-3 fatty acids 1000 MG capsule, Take 2 g by mouth daily. , Disp: , Rfl:    Multiple Vitamin (MULTI VITAMIN MENS PO), Take by mouth daily., Disp: , Rfl:    Study - [facility] - evolocumab (AMG 145) 140 mg/mL or placebo SQ injection (PI-Hilty), Inject 1 mL (140 mg total) into the skin every 14 (fourteen) days. For Investigational Use Only. Inject 1 mL (contents of one pen) subcutaneously every 2 weeks. Return kits at next visit., Disp: 8 mL, Rfl: 0   terazosin (HYTRIN) 10 MG capsule, Take 5 mg by mouth every other day., Disp: , Rfl:

## 2024-04-10 NOTE — Progress Notes (Unsigned)
 Cardiology Office Note:  .   Date:  04/11/2024  ID:  Edward Medina, DOB November 12, 1956, MRN [id number] PCP: Rodney Clamp, MD   HeartCare Providers Cardiologist:  Kyra Phy, MD    History of Present Illness: .    Edward Medina is a 68 y.o. male with CAD on imaging and hyperlipidemia here for follow up.  He had a family history of premature CAD and had a calcium  score in 2021 that was 198.  This was 76th for age and gender.  He has a history of statin intolerance but has done well on statins.  He saw Dr. Lorie Rook 11/2022 and was doing well.  He is transferring to [facility] as his wife is my patient as well.  Discussed the use of AI scribe software for clinical note transcription with the patient, who gave verbal consent to proceed.  History of Present Illness Edward Medina is currently participating in a clinical trial for Repatha , which is set to end this year. He has previously tried statins, including Crestor, but experienced disorientation as a side effect. He maintains an active lifestyle, playing roller hockey weekly and exercising every other day. He follows a diet rich in fruits, vegetables, poultry, and fish, and avoids fast food and sugary drinks. He occasionally consumes wine and beer on weekends. No swelling in his legs or feet, shortness of breath when lying down, or significant changes in exercise tolerance. He has never used nitroglycerin  pills prescribed in the past.  He was diagnosed with an acoustic neuroma in December 2022, which has significantly impacted his hearing. He uses a custom ear plug to manage sound sensitivity, as he cannot tolerate loud noises, including his own voice. He does not wear the ear plug during hockey due to the risk of losing it.  He experiences occasional leg cramps, particularly at night or early morning, which have been more frequent in the last two to three years. He acknowledges not drinking enough fluids and is working on improving his  hydration.  He has a family history of coronary artery disease, with his father having passed away at 3 from a massive coronary event. He is concerned about familial cholesterol markers and is proactive in managing his health to surpass his father's age.   ROS: As per HPI  Studies Reviewed: Aaron Aas   EKG Interpretation Date/Time:  Wednesday April 11 2024 08:12:29 EDT Ventricular Rate:  59 PR Interval:  176 QRS Duration:  88 QT Interval:  416 QTC Calculation: 411 R Axis:   61  Text Interpretation: Sinus bradycardia When compared with ECG of 05-Jun-2004 08:38, No significant change was found Confirmed by Maudine Sos ([id number]) on 04/11/2024 8:20:01 AM     Risk Assessment/Calculations:             Physical Exam:    VS:  BP 112/80   Pulse (!) 59   Ht 5\' 9"  (1.753 m)   Wt 170 lb 9.6 oz (77.4 kg)   BMI 25.19 kg/m  , BMI Body mass index is 25.19 kg/m. GENERAL:  Well appearing HEENT: Pupils equal round and reactive, fundi not visualized, oral mucosa unremarkable NECK:  No jugular venous distention, waveform within normal limits, carotid upstroke brisk and symmetric, no bruits, no thyromegaly LUNGS:  Clear to auscultation bilaterally HEART:  RRR.  PMI not displaced or sustained,S1 and S2 within normal limits, no S3, no S4, no clicks, no rubs, no murmurs ABD:  Flat, positive bowel sounds normal in frequency in pitch,  no bruits, no rebound, no guarding, no midline pulsatile mass, no hepatomegaly, no splenomegaly EXT:  2 plus pulses throughout, no edema, no cyanosis no clubbing SKIN:  No rashes no nodules NEURO:  Cranial nerves II through XII grossly intact, motor grossly intact throughout PSYCH:  Cognitively intact, oriented to person place and time   ASSESSMENT AND PLAN: .    Assessment & Plan # Coronary atherosclerosis Coronary atherosclerosis with calcium  score of 198, 76th percentile for age, race, gender. Asymptomatic, no obstructive disease. Statin intolerant, improved  cholesterol with Repatha . Discussed genetics, aggressive treatment importance, calcified plaque stability. - Continue Repatha  clinical trial. - Continue aspirin 81mg  - Order LP(a) test with next lipid panel.   - Contact office post-trial for Repatha  transition. - Encourage regular physical activity, healthy diet. - Advise against nitroglycerin .  # Statin intolerance Intolerance to statins, specifically Crestor, causing disorientation. Repatha  effective without adverse effects. Insurance coverage for Repatha  likely post-trial due to coronary disease, statin intolerance. - Continue Repatha  clinical trial. - Transition to Repatha  therapy post-trial with expected insurance coverage.  # Sinus bradycardia Sinus bradycardia with heart rate of 59 bpm, normal for active individual. No concerning symptoms. Likely due to high physical fitness.  # Acoustic neuroma Diagnosed December 2022. Significant sound intolerance managed with custom ear plug. No issues during physical activities.  # Follow-up Discussed monitoring cholesterol levels, Repatha  transition post-trial. Advised monitoring exercise tolerance, symptoms. - Order fasting lipid panel and LP(a) test at convenience. - Contact office post-Repatha  trial. - Monitor exercise tolerance, symptoms.         Dispo: f/u 1 year  Signed, Maudine Sos, MD

## 2024-04-11 ENCOUNTER — Encounter (HOSPITAL_BASED_OUTPATIENT_CLINIC_OR_DEPARTMENT_OTHER): Payer: Self-pay | Admitting: Cardiovascular Disease

## 2024-04-11 ENCOUNTER — Ambulatory Visit (INDEPENDENT_AMBULATORY_CARE_PROVIDER_SITE_OTHER): Payer: 59 | Admitting: Cardiovascular Disease

## 2024-04-11 VITALS — BP 112/80 | HR 59 | Ht 69.0 in | Wt 170.6 lb

## 2024-04-11 DIAGNOSIS — I2510 Atherosclerotic heart disease of native coronary artery without angina pectoris: Secondary | ICD-10-CM | POA: Diagnosis not present

## 2024-04-11 DIAGNOSIS — Z5181 Encounter for therapeutic drug level monitoring: Secondary | ICD-10-CM

## 2024-04-11 DIAGNOSIS — I2583 Coronary atherosclerosis due to lipid rich plaque: Secondary | ICD-10-CM | POA: Diagnosis not present

## 2024-04-11 DIAGNOSIS — E785 Hyperlipidemia, unspecified: Secondary | ICD-10-CM | POA: Diagnosis not present

## 2024-04-11 NOTE — Patient Instructions (Signed)
 Medication Instructions:  Your physician recommends that you continue on your current medications as directed. Please refer to the Current Medication list given to you today.   *If you need a refill on your cardiac medications before your next appointment, please call your pharmacy*  Lab Work: FASTING LP/CMET/LPa SOON   If you have labs (blood work) drawn today and your tests are completely normal, you will receive your results only by: MyChart Message (if you have MyChart) OR A paper copy in the mail If you have any lab test that is abnormal or we need to change your treatment, we will call you to review the results.  Testing/Procedures: NONE  Follow-Up: At [facility], you and your health needs are our priority.  As part of our continuing mission to provide you with exceptional heart care, our providers are all part of one team.  This team includes your primary Cardiologist (physician) and Advanced Practice Providers or APPs (Physician Assistants and Nurse Practitioners) who all work together to provide you with the care you need, when you need it.  Your next appointment:   12 month(s)  Provider:   Maudine Sos, MD, Slater Duncan, NP, or Neomi Banks, NP    We recommend signing up for the patient portal called "MyChart".  Sign up information is provided on this After Visit Summary.  MyChart is used to connect with patients for Virtual Visits (Telemedicine).  Patients are able to view lab/test results, encounter notes, upcoming appointments, etc.  Non-urgent messages can be sent to your provider as well.   To learn more about what you can do with MyChart, go to [email or web address].

## 2024-05-24 ENCOUNTER — Telehealth: Payer: Self-pay | Admitting: Family Medicine

## 2024-05-24 NOTE — Telephone Encounter (Signed)
 Copied from CRM [id number]. Topic: Appointments - Appointment Cancel/Reschedule >> May 23, 2024  2:34 PM Juleen Oakland F wrote: Patient called to cancel Welcome to Medicare visit 05/30/24, please call him to reschedule at [phone]. Schedule is not populating for Welcome To Medicare visit.  LVM to reschedule WTM with Daneil Dunker.

## 2024-05-30 ENCOUNTER — Encounter: Payer: 59 | Admitting: Family Medicine

## 2024-06-11 ENCOUNTER — Ambulatory Visit (INDEPENDENT_AMBULATORY_CARE_PROVIDER_SITE_OTHER): Payer: 59 | Admitting: Otolaryngology

## 2024-06-13 ENCOUNTER — Encounter: Payer: Self-pay | Admitting: Family Medicine

## 2024-06-13 ENCOUNTER — Ambulatory Visit (INDEPENDENT_AMBULATORY_CARE_PROVIDER_SITE_OTHER): Payer: Self-pay | Admitting: Family Medicine

## 2024-06-13 VITALS — BP 114/74 | HR 63 | Temp 97.0°F | Ht 69.0 in | Wt 173.8 lb

## 2024-06-13 DIAGNOSIS — Z23 Encounter for immunization: Secondary | ICD-10-CM | POA: Diagnosis not present

## 2024-06-13 DIAGNOSIS — E785 Hyperlipidemia, unspecified: Secondary | ICD-10-CM | POA: Diagnosis not present

## 2024-06-13 DIAGNOSIS — N401 Enlarged prostate with lower urinary tract symptoms: Secondary | ICD-10-CM

## 2024-06-13 DIAGNOSIS — D333 Benign neoplasm of cranial nerves: Secondary | ICD-10-CM | POA: Diagnosis not present

## 2024-06-13 DIAGNOSIS — Z0000 Encounter for general adult medical examination without abnormal findings: Secondary | ICD-10-CM | POA: Diagnosis not present

## 2024-06-13 DIAGNOSIS — R739 Hyperglycemia, unspecified: Secondary | ICD-10-CM

## 2024-06-13 DIAGNOSIS — R351 Nocturia: Secondary | ICD-10-CM

## 2024-06-13 NOTE — Assessment & Plan Note (Signed)
 Follows with cardiology for this.  On Repatha  trial.

## 2024-06-13 NOTE — Assessment & Plan Note (Signed)
 Continue management per ENT.  Has upcoming appoint with them later this week.

## 2024-06-13 NOTE — Patient Instructions (Signed)
 It was very nice to see you today!  We will give you the pneumonia vaccine today.  Please continue to work on diet and exercise.  I will see back in year for your physical.  Come back sooner if needed.  Return in about 1 year (around 06/13/2025) for Annual Physical.   Take care, Dr Kennyth  PLEASE NOTE:  If you had any lab tests, please let us  know if you have not heard back within a few days. You may see your results on mychart before we have a chance to review them but we will give you a call once they are reviewed by us .   If we ordered any referrals today, please let us  know if you have not heard from their office within the next week.   If you had any urgent prescriptions sent in today, please check with the pharmacy within an hour of our visit to make sure the prescription was transmitted appropriately.   Please try these tips to maintain a healthy lifestyle:  Eat at least 3 REAL meals and 1-2 snacks per day.  Aim for no more than 5 hours between eating.  If you eat breakfast, please do so within one hour of getting up.   Each meal should contain half fruits/vegetables, one quarter protein, and one quarter carbs (no bigger than a computer mouse)  Cut down on sweet beverages. This includes juice, soda, and sweet tea.   Drink at least 1 glass of water with each meal and aim for at least 8 glasses per day  Exercise at least 150 minutes every week.    Preventive Care 3 Years and Older, Male Preventive care refers to lifestyle choices and visits with your health care provider that can promote health and wellness. Preventive care visits are also called wellness exams. What can I expect for my preventive care visit? Counseling During your preventive care visit, your health care provider may ask about your: Medical history, including: Past medical problems. Family medical history. History of falls. Current health, including: Emotional well-being. Home life and relationship  well-being. Sexual activity. Memory and ability to understand (cognition). Lifestyle, including: Alcohol, nicotine or tobacco, and drug use. Access to firearms. Diet, exercise, and sleep habits. Work and work Astronomer. Sunscreen use. Safety issues such as seatbelt and bike helmet use. Physical exam Your health care provider will check your: Height and weight. These may be used to calculate your BMI (body mass index). BMI is a measurement that tells if you are at a healthy weight. Waist circumference. This measures the distance around your waistline. This measurement also tells if you are at a healthy weight and may help predict your risk of certain diseases, such as type 2 diabetes and high blood pressure. Heart rate and blood pressure. Body temperature. Skin for abnormal spots. What immunizations do I need?  Vaccines are usually given at various ages, according to a schedule. Your health care provider will recommend vaccines for you based on your age, medical history, and lifestyle or other factors, such as travel or where you work. What tests do I need? Screening Your health care provider may recommend screening tests for certain conditions. This may include: Lipid and cholesterol levels. Diabetes screening. This is done by checking your blood sugar (glucose) after you have not eaten for a while (fasting). Hepatitis C test. Hepatitis B test. HIV (human immunodeficiency virus) test. STI (sexually transmitted infection) testing, if you are at risk. Lung cancer screening. Colorectal cancer screening. Prostate cancer  screening. Abdominal aortic aneurysm (AAA) screening. You may need this if you are a current or former smoker. Talk with your health care provider about your test results, treatment options, and if necessary, the need for more tests. Follow these instructions at home: Eating and drinking  Eat a diet that includes fresh fruits and vegetables, whole grains, lean  protein, and low-fat dairy products. Limit your intake of foods with high amounts of sugar, saturated fats, and salt. Take vitamin and mineral supplements as recommended by your health care provider. Do not drink alcohol if your health care provider tells you not to drink. If you drink alcohol: Limit how much you have to 0-2 drinks a day. Know how much alcohol is in your drink. In the [place] one drink equals one 12 oz bottle of beer (355 mL), one 5 oz glass of wine (148 mL), or one 1 oz glass of hard liquor (44 mL). Lifestyle Brush your teeth every morning and night with fluoride toothpaste. Floss one time each day. Exercise for at least 30 minutes 5 or more days each week. Do not use any products that contain nicotine or tobacco. These products include cigarettes, chewing tobacco, and vaping devices, such as e-cigarettes. If you need help quitting, ask your health care provider. Do not use drugs. If you are sexually active, practice safe sex. Use a condom or other form of protection to prevent STIs. Take aspirin only as told by your health care provider. Make sure that you understand how much to take and what form to take. Work with your health care provider to find out whether it is safe and beneficial for you to take aspirin daily. Ask your health care provider if you need to take a cholesterol-lowering medicine (statin). Find healthy ways to manage stress, such as: Meditation, yoga, or listening to music. Journaling. Talking to a trusted person. Spending time with friends and family. Safety Always wear your seat belt while driving or riding in a vehicle. Do not drive: If you have been drinking alcohol. Do not ride with someone who has been drinking. When you are tired or distracted. While texting. If you have been using any mind-altering substances or drugs. Wear a helmet and other protective equipment during sports activities. If you have firearms in your house, make sure you follow  all gun safety procedures. Minimize exposure to UV radiation to reduce your risk of skin cancer. What's next? Visit your health care provider once a year for an annual wellness visit. Ask your health care provider how often you should have your eyes and teeth checked. Stay up to date on all vaccines. This information is not intended to replace advice given to you by your health care provider. Make sure you discuss any questions you have with your health care provider. Document Revised: 06/03/2021 Document Reviewed: 06/03/2021 Elsevier Patient Education  2024 [employer].

## 2024-06-13 NOTE — Assessment & Plan Note (Signed)
 Discussed recent A1c's of 5.8-5.9.  He is working on lifestyle interventions.  Recheck in a year.

## 2024-06-13 NOTE — Assessment & Plan Note (Signed)
 Follows with urology.  Symptoms are stable on finasteride 5 mg daily.  He is weaning down on terazosin 5 mg every other day.

## 2024-06-13 NOTE — Progress Notes (Signed)
 Chief Complaint:  Edward Medina is a 68 y.o. male who presents today for a Medicare Initial Preventive Examination.  Assessment/Plan:  Chronic Problems Addressed Today: Benign prostatic hyperplasia Follows with urology.  Symptoms are stable on finasteride 5 mg daily.  He is weaning down on terazosin 5 mg every other day.  Hyperlipidemia LDL goal <100 Follows with cardiology for this.  On Repatha  trial.  Acoustic neuroma (HCC) Continue management per ENT.  Has upcoming appoint with them later this week.  Hyperglycemia Discussed recent A1c's of 5.8-5.9.  He is working on lifestyle interventions.  Recheck in a year.  Preventative Healthcare Pneumonia vaccine given today.  Up-to-date on colonoscopy-due for this again in 8 years.  Up-to-date on tetanus vaccine.  Gets prostate cancer screening through urology.  Up-to-date on other vaccines. Health Maintenance  Topic Date Due   Pneumococcal Vaccine: 50+ Years (1 of 2 - PCV) Never done   COVID-19 Vaccine (8 - 2024-25 season) 03/09/2024   INFLUENZA VACCINE  07/20/2024   Medicare Annual Wellness (AWV)  06/13/2025   DTaP/Tdap/Td (3 - Td or Tdap) 12/06/2029   Colonoscopy  06/24/2032   Hepatitis C Screening  Completed   HIV Screening  Completed   Zoster Vaccines- Shingrix  Completed   Hepatitis B Vaccines  Aged Out   HPV VACCINES  Aged Out   Meningococcal B Vaccine  Aged Out    Patient Counseling(The following topics were reviewed and/or handout was given):  -Nutrition: Stressed importance of moderation in sodium/caffeine intake, saturated fat and cholesterol, caloric balance, sufficient intake of fresh fruits, vegetables, and fiber.  -Stressed the importance of regular exercise.   -Substance Abuse: Discussed cessation/primary prevention of tobacco, alcohol, or other drug use; driving or other dangerous activities under the influence; availability of treatment for abuse.   -Injury prevention: Discussed safety belts, safety helmets,  smoke detector, smoking near bedding or upholstery.   -Sexuality: Discussed sexually transmitted diseases, partner selection, use of condoms, avoidance of unintended pregnancy and contraceptive alternatives.   -Dental health: Discussed importance of regular tooth brushing, flossing, and dental visits.  -Health maintenance and immunizations reviewed. Please refer to Health maintenance section.  Advance Directives were discussed with the patient.   Patient Instructions (the written plan) was given to the patient.  Medicare Attestation I have personally reviewed: The patient's medical and social history Their use of alcohol, tobacco or illicit drugs Their current medications and supplements The patient's functional ability including ADLs,fall risks, home safety risks, cognitive, and hearing and visual impairment Diet and physical activities Evidence for depression or mood disorders His use of opioid medications, reviewed Opioid use disorder risk factors, evaluated pain severity and current treatment plan, and provided information on non-opioid treatment options   The patient's weight, height, BMI, and visual acuity have been recorded in the chart.  I have made referrals, counseling, and provided education to the patient based on review of the above and I have provided the patient with a written personalized care plan for preventive services.    During the course of the visit the patient was educated and counseled about appropriate screening and preventive services including:        Pneumococcal vaccine  Screening electrocardiogram Advanced directives: has an advanced directive - a copy HAS NOT been provided.    Subjective:  HPI:  See A/P for status of chronic conditions.  Patient is here today for his welcome to Medicare visit.  I last saw him about 6 months ago for his  previous annual physical.  We did check labs at that time which showed borderline A1c however the remainder of his labs  were at goal.  Activities of Daily Living: He has no difficulty performing the following: Preparing food and eating Bathing  Getting dressed Using the toilet Shopping Managing Finances Moving around from place to place  Fall Screening: He has not had any falls within the past year.   Hearing Screening: Follows with ENT for acoustic neuroma. Has ear plug aid in place.   Safety Screening Patient feels safe at home.  There are no smokers at home.   Depression Screening:    06/13/2024    9:01 AM  Depression screen PHQ 2/9  Decreased Interest 0  Down, Depressed, Hopeless 0  PHQ - 2 Score 0    Lifestyle Factors: Diet: Balanced. Trying to get plenty of fruits and vegetables.  Exercise: Roller hockey. Treadmill at home.    Patient Care Team: Kennyth Worth HERO, MD as PCP - General (Family Medicine) Wendel Lurena POUR, MD as PCP - Cardiology (Cardiology) Maranda Leim DEL, MD as Consulting Physician (Cardiology) Shari Easter, MD as Consulting Physician (Orthopedic Surgery) Donnald Charleston, MD as Consulting Physician (Gastroenterology) Renda Glance, MD as Consulting Physician (Urology)   ROS: Per HPI  PMH:  The following were reviewed and entered/updated in epic: Past Medical History:  Diagnosis Date   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   FH: CAD (coronary artery disease) 12/24/2015   High cholesterol    Migraines    Past Surgical History:  Procedure Laterality Date   HERNIA REPAIR  2005   STRABISMUS SURGERY Bilateral 07/23/2022   Procedure: REPAIR STRABISMUS BILATERAL;  Surgeon: Tobie Factor, MD;  Location: [facility];  Service: Ophthalmology;  Laterality: Bilateral;   TONSILLECTOMY  1967   WISDOM TOOTH EXTRACTION  1976   Family History  Problem Relation Age of Onset   Heart disease Father    Hyperlipidemia Father    Heart attack Father    Stroke Maternal Grandmother    Heart disease Maternal Grandfather     Medications- reviewed and  updated Current Outpatient Medications  Medication Sig Dispense Refill   aluminum chloride (DRYSOL) 20 % external solution Apply topically at bedtime.     aspirin 81 MG tablet Take 81 mg by mouth daily.     Cholecalciferol (VITAMIN D3) 50 MCG (2000 UT) TABS Take 1 tablet by mouth daily.      COLLAGEN PO Adds as a daily supplement to coffee     finasteride (PROSCAR) 5 MG tablet Take 5 mg by mouth daily.     fish oil-omega-3 fatty acids 1000 MG capsule Take 2 g by mouth daily.      Multiple Vitamin (MULTI VITAMIN MENS PO) Take by mouth daily.     Study - VESALIUS - evolocumab  (AMG 145) 140 mg/mL or placebo SQ injection (PI-Hilty) Inject 1 mL (140 mg total) into the skin every 14 (fourteen) days. For Investigational Use Only. Inject 1 mL (contents of one pen) subcutaneously every 2 weeks. Return kits at next visit. 8 mL 0   terazosin (HYTRIN) 10 MG capsule Take 5 mg by mouth every other day.     Evolocumab  (REPATHA ) 140 MG/ML SOSY Inject 1 ml Subcutaneously every 2 weeks     No current facility-administered medications for this visit.    Allergies-reviewed and updated Allergies  Allergen Reactions   Atorvastatin  Other (See Comments)    Memory impairment   Pravastatin  Other (See Comments)  Memory loss/impairment   Simvastatin Other (See Comments)    MADE HIM FEEL DISORIENTED   Amoxicillin Rash    SEVERE    Social History   Socioeconomic History   Marital status: Married    Spouse name: Not on file   Number of children: 2   Years of education: Not on file   Highest education level: Bachelor's degree (e.g., BA, AB, BS)  Occupational History   Occupation: Scientist, product/process development  Tobacco Use   Smoking status: Never   Smokeless tobacco: Never  Vaping Use   Vaping status: Never Used  Substance and Sexual Activity   Alcohol use: Yes    Alcohol/week: 2.0 standard drinks of alcohol    Types: 1 Glasses of wine, 1 Cans of beer per week    Comment: drink - beer, wine, and hard  liquor-occassionally   Drug use: No   Sexual activity: Yes    Partners: Female    Comment: number of sex partners in the last 12 months - 1, birth control method  (pill)  Other Topics Concern   Not on file  Social History Narrative   Exercise indoor roller hockey 1-2 times per week for 2 hours   College   Married   Chief Executive Officer         Social Drivers of Health   Financial Resource Strain: Low Risk  (06/12/2024)   Overall Financial Resource Strain (CARDIA)    Difficulty of Paying Living Expenses: Not hard at all  Food Insecurity: No Food Insecurity (06/12/2024)   Hunger Vital Sign    Worried About Running Out of Food in the Last Year: Never true    Ran Out of Food in the Last Year: Never true  Transportation Needs: No Transportation Needs (06/12/2024)   PRAPARE - Administrator, Civil Service (Medical): No    Lack of Transportation (Non-Medical): No  Physical Activity: Sufficiently Active (06/12/2024)   Exercise Vital Sign    Days of Exercise per Week: 4 days    Minutes of Exercise per Session: 60 min  Stress: No Stress Concern Present (06/12/2024)   [employer] of Occupational Health - Occupational Stress Questionnaire    Feeling of Stress: Not at all  Social Connections: Moderately Integrated (06/12/2024)   Social Connection and Isolation Panel    Frequency of Communication with Friends and Family: Twice a week    Frequency of Social Gatherings with Friends and Family: Three times a week    Attends Religious Services: Never    Active Member of Clubs or Organizations: Yes    Attends Banker Meetings: More than 4 times per year    Marital Status: Married         Objective/Observations  Physical Exam: BP 114/74   Pulse 63   Temp (!) 97 F (36.1 C) (Temporal)   Ht 5' 9 (1.753 m)   Wt 173 lb 12.8 oz (78.8 kg)   SpO2 95%   BMI 25.67 kg/m  Wt Readings from Last 3 Encounters:  06/13/24 173 lb 12.8 oz (78.8 kg)  04/11/24 170 lb 9.6  oz (77.4 kg)  03/09/24 170 lb (77.1 kg)      06/13/2024    9:01 AM 06/12/2024   11:26 AM 11/23/2023    7:46 AM 02/18/2023    8:26 AM 11/17/2022    8:23 AM  Fall Risk   Falls in the past year? 0 0 0 0 0  Number falls in past yr: 0 0  0 0 0  Injury with Fall? 0 0 0 0 0  Risk for fall due to : No Fall Risks  No Fall Risks No Fall Risks No Fall Risks   Vision Screening   Right eye Left eye Both eyes  Without correction     With correction 20/20 20/30 20/20    Gen: NAD, resting comfortably CV: RRR with no murmurs appreciated Pulm: NWOB, CTAB with no crackles, wheezes, or rhonchi GI: Normal bowel sounds present. Soft, Nontender, Nondistended. MSK: no edema, cyanosis, or clubbing noted Skin: warm, dry Neuro: grossly normal, moves all extremities Psych: Normal affect and thought content. Normal minicog.       Worth HERO. Kennyth, MD 06/13/2024 9:38 AM

## 2024-06-21 ENCOUNTER — Encounter: Payer: Self-pay | Admitting: *Deleted

## 2024-06-21 ENCOUNTER — Other Ambulatory Visit (HOSPITAL_COMMUNITY): Payer: Self-pay

## 2024-06-21 ENCOUNTER — Encounter

## 2024-06-21 VITALS — BP 101/65 | HR 62 | Temp 97.9°F | Resp 16 | Wt 173.0 lb

## 2024-06-21 DIAGNOSIS — I2583 Coronary atherosclerosis due to lipid rich plaque: Secondary | ICD-10-CM | POA: Diagnosis not present

## 2024-06-21 DIAGNOSIS — Z006 Encounter for examination for normal comparison and control in clinical research program: Secondary | ICD-10-CM

## 2024-06-21 DIAGNOSIS — I2510 Atherosclerotic heart disease of native coronary artery without angina pectoris: Secondary | ICD-10-CM | POA: Diagnosis not present

## 2024-06-21 DIAGNOSIS — Z5181 Encounter for therapeutic drug level monitoring: Secondary | ICD-10-CM | POA: Diagnosis not present

## 2024-06-21 DIAGNOSIS — E785 Hyperlipidemia, unspecified: Secondary | ICD-10-CM | POA: Diagnosis not present

## 2024-06-21 NOTE — Research (Addendum)
 Vitals: [x]  Experience any AE/SAE/Hospitalizations []   If yes please explain:   Non-Fatal Potential Endpoint Assessment Yes  No   Has the subject experienced/undergone any of the following since the last visit/contact?   []   [x]    Any Coronary Artery Revascularization/Cerebrovascular Revascularization/ Peripheral Artery Revascularization/Amputation Procedure   []   [x]    Myocardial Infarction []   [x]    Stroke   []   [x]    Provide the date for the non-fatal Potential Endpoints status:   06/21/2024   Thanked the patient for participating in the study.  Cardiologist has been contacted as what next steps are for this patient.  Date of last IP admin: 06/15/2024 Subject completed  USED IP brought back: [id number] = [id number]     [id number] =3

## 2024-06-22 LAB — LIPID PANEL
Chol/HDL Ratio: 2.8 ratio (ref 0.0–5.0)
Cholesterol, Total: 139 mg/dL (ref 100–199)
HDL: 49 mg/dL (ref >39)
LDL Chol Calc (NIH): 74 mg/dL (ref 0–99)
Triglycerides: 83 mg/dL (ref 0–149)
VLDL Cholesterol Cal: 16 mg/dL (ref 5–40)

## 2024-06-22 LAB — COMPREHENSIVE METABOLIC PANEL WITH GFR
ALT: 56 IU/L — ABNORMAL HIGH (ref 0–44)
AST: 26 IU/L (ref 0–40)
Albumin: 4.3 g/dL (ref 3.9–4.9)
Alkaline Phosphatase: 148 IU/L — ABNORMAL HIGH (ref 44–121)
BUN/Creatinine Ratio: 16 (ref 10–24)
BUN: 16 mg/dL (ref 8–27)
Bilirubin Total: 0.5 mg/dL (ref 0.0–1.2)
CO2: 22 mmol/L (ref 20–29)
Calcium: 9.7 mg/dL (ref 8.6–10.2)
Chloride: 102 mmol/L (ref 96–106)
Creatinine, Ser: 1.01 mg/dL (ref 0.76–1.27)
Globulin, Total: 2.5 g/dL (ref 1.5–4.5)
Glucose: 92 mg/dL (ref 70–99)
Potassium: 4.1 mmol/L (ref 3.5–5.2)
Sodium: 141 mmol/L (ref 134–144)
Total Protein: 6.8 g/dL (ref 6.0–8.5)
eGFR: 83 mL/min/1.73 (ref >59)

## 2024-06-22 LAB — LIPOPROTEIN A (LPA): Lipoprotein (a): 212.3 nmol/L — ABNORMAL HIGH (ref <75.0)

## 2024-06-25 ENCOUNTER — Other Ambulatory Visit (HOSPITAL_BASED_OUTPATIENT_CLINIC_OR_DEPARTMENT_OTHER): Payer: Self-pay | Admitting: *Deleted

## 2024-06-25 NOTE — Telephone Encounter (Signed)
-----   Message from [facility] sent at 06/25/2024  9:02 AM EDT ----- Regarding: RE: vesalius study end Thanks Suzen,  We should be able to just send Repatha  to the pharmacy for him. [place], can you please help with this?  ~Tiffany ----- Message ----- From: Norita Suzen BIRCH, RN Sent: 06/21/2024   1:24 PM EDT To: Annabella Scarce, MD Subject: vesalius study end                             Hey Dr Scarce,   Mr Scarber has completed the vesalius study. After he left his appt today (7/3) he went across the street to get his labs drawn for you.  He has new insurance due to him retiring.  He said that y'all had spoke about starting him on Reptha after study.  Do you want to set that up with your nurse or do you want to send him to the lipid clinic?  Let me know what I can do to help.  Hope you had a great vacation.   Thanks  [place] :)

## 2024-06-26 NOTE — Research (Addendum)
 Are there any labs that are clinically significant?  Yes []  OR No[x]   Edward KYM Maxcy, MD, [facility], FNLA, FACP  [place]  [facility]  Medical Director of the Advanced Lipid Disorders &  Cardiovascular Risk Reduction Clinic Diplomate of the American Board of Clinical Lipidology Attending Cardiologist  Direct Dial: [phone]  Fax: [phone]  Website:  [email or web address]

## 2024-06-27 ENCOUNTER — Telehealth (HOSPITAL_BASED_OUTPATIENT_CLINIC_OR_DEPARTMENT_OTHER): Payer: Self-pay

## 2024-06-27 ENCOUNTER — Telehealth: Payer: Self-pay

## 2024-06-27 ENCOUNTER — Encounter (HOSPITAL_BASED_OUTPATIENT_CLINIC_OR_DEPARTMENT_OTHER): Payer: Self-pay | Admitting: Cardiovascular Disease

## 2024-06-27 ENCOUNTER — Other Ambulatory Visit (HOSPITAL_COMMUNITY): Payer: Self-pay

## 2024-06-27 ENCOUNTER — Other Ambulatory Visit (HOSPITAL_BASED_OUTPATIENT_CLINIC_OR_DEPARTMENT_OTHER): Payer: Self-pay

## 2024-06-27 MED ORDER — REPATHA SURECLICK 140 MG/ML ~~LOC~~ SOAJ
140.0000 mg | SUBCUTANEOUS | 3 refills | Status: AC
  Filled 2024-06-27: qty 6, 84d supply, fill #0
  Filled 2024-09-17: qty 6, 84d supply, fill #1
  Filled 2024-11-09 (×2): qty 6, 84d supply, fill #2

## 2024-06-27 NOTE — Telephone Encounter (Signed)
 PA request has been Submitted. New Encounter has been or will be created for follow up. For additional info see Pharmacy Prior Auth telephone encounter from 06/27/24.

## 2024-06-27 NOTE — Telephone Encounter (Signed)
 Pharmacy Patient Advocate Encounter   Received notification from Physician's Office that prior authorization for REPATHA  is required/requested.   Insurance verification completed.   The patient is insured through [facility] ADVANTAGE/RX ADVANCE .   Per test claim: PA required; PA submitted to above mentioned insurance via CoverMyMeds Key/confirmation #/EOC [id number] Status is pending

## 2024-06-27 NOTE — Telephone Encounter (Signed)
 Called pt- no answer, left detailed message (ok per dpr) to call back or send mychart message with pharmacy information.

## 2024-06-27 NOTE — Telephone Encounter (Signed)
 Updated insurance cards for reptha PA

## 2024-06-27 NOTE — Telephone Encounter (Signed)
 Patient returned call to office. Answered questions about repatha , will send rx into pharmacy. Most recent insurance card not in EPIC, patient will send now in [place]. Advised will put a rush on PA if possible due to pt being due for next injection Friday and leaving town. He will pick up one month supply at [facility] pharmacy and then transition to [facility] mail order.

## 2024-06-27 NOTE — Telephone Encounter (Signed)
 Pharmacy Patient Advocate Encounter  Received notification from [employer] that Prior Authorization for REPATHA  has been APPROVED from 06/27/24 to 06/23/25. Ran test claim, Copay is $47. This test claim was processed through [facility]- copay amounts may vary at other pharmacies due to pharmacy/plan contracts, or as the patient moves through the different stages of their insurance plan.

## 2024-06-29 ENCOUNTER — Encounter

## 2024-07-02 ENCOUNTER — Ambulatory Visit: Payer: Self-pay | Admitting: Cardiovascular Disease

## 2024-07-20 ENCOUNTER — Encounter (INDEPENDENT_AMBULATORY_CARE_PROVIDER_SITE_OTHER): Payer: Self-pay | Admitting: Otolaryngology

## 2024-07-20 ENCOUNTER — Ambulatory Visit (INDEPENDENT_AMBULATORY_CARE_PROVIDER_SITE_OTHER): Admitting: Audiology

## 2024-07-20 ENCOUNTER — Ambulatory Visit (INDEPENDENT_AMBULATORY_CARE_PROVIDER_SITE_OTHER): Admitting: Otolaryngology

## 2024-07-20 VITALS — BP 120/79 | HR 60

## 2024-07-20 DIAGNOSIS — H9041 Sensorineural hearing loss, unilateral, right ear, with unrestricted hearing on the contralateral side: Secondary | ICD-10-CM | POA: Diagnosis not present

## 2024-07-20 DIAGNOSIS — H9311 Tinnitus, right ear: Secondary | ICD-10-CM

## 2024-07-20 DIAGNOSIS — D333 Benign neoplasm of cranial nerves: Secondary | ICD-10-CM

## 2024-07-20 DIAGNOSIS — H903 Sensorineural hearing loss, bilateral: Secondary | ICD-10-CM | POA: Diagnosis not present

## 2024-07-20 DIAGNOSIS — H6123 Impacted cerumen, bilateral: Secondary | ICD-10-CM

## 2024-07-20 NOTE — Progress Notes (Addendum)
 Patient ID: Edward Medina, male   DOB: 10-09-1956, 67 y.o.   MRN: [id number]  Follow-up: Asymmetric right ear hearing loss, right ear vestibular schwannoma  HPI:  The patient is a 68 year old male who returns today for his follow-up evaluation.  The patient was previously seen for his asymmetric right ear hearing loss. 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  The treatment options were discussed.  The options include conservative observation, gamma knife radiation, or surgical excision.  The patient opted for conservative observation.  His repeat MRI scan last year showed the size of the tumor was stable. The patient has no new complaint today. His right ear is still sensitive to sound.  He currently wears a right ear custom-made earplug.  He denies any change in his hearing, otalgia, otorrhea, or vertigo.  Exam: General: Communicates without difficulty, well nourished, no acute distress. Head: Normocephalic, no evidence injury, no tenderness, facial buttresses intact without stepoff. Eyes: PERRL, EOMI. No scleral icterus, conjunctivae clear. Neuro: CN II exam reveals vision grossly intact.  No nystagmus at any point of gaze. Ears: Auricles well formed without lesions.  Bilateral cerumen impaction.  Nose: External evaluation reveals normal support and skin without lesions.  Dorsum is intact.  Anterior rhinoscopy reveals healthy pink mucosa over anterior aspect of inferior turbinates and intact septum.  No purulence noted. Oral:  Oral cavity and oropharynx are intact, symmetric, without erythema or edema.  Mucosa is moist without lesions. Neck: Full range of motion without pain.  There is no significant lymphadenopathy.  No masses palpable.  Thyroid  bed within normal limits to palpation.  Parotid glands and submandibular glands equal bilaterally without mass.  Trachea is midline. Neuro:  CN 2-12 grossly intact. Gait normal. Vestibular: No nystagmus at any point of gaze. The  cerebellar examination is unremarkable.   Procedure: Bilateral cerumen disimpaction Anesthesia: None Description: Under the operating microscope, the cerumen is carefully removed with a combination of cerumen currette, alligator forceps, and suction catheters.  After the cerumen is removed, the TMs are noted to be normal.  No mass, erythema, or lesions. The patient tolerated the procedure well.   His hearing test shows stable asymmetric right ear sensorineural hearing loss.  Assessment 1.  Bilateral cerumen impaction.   2.  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 2.  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 3.  The patient currently has no vestibular symptoms.  He denies any dizziness or vertigo.  Plan: 1.  The physical exam findings are reviewed.   2.  Otomicroscopy with bilateral cerumen disimpaction. 3.  The patient is a candidate for hearing amplification.   4.  Repeat MRI scan in 1 year. 5.  The patient will return for reevaluation in 1 year after her MRI scan.

## 2024-07-20 NOTE — Progress Notes (Signed)
  [address] [place], [place] [address] [phone]  Audiological Evaluation    Name: Edward Medina     DOB:   Aug 21, 1956      MRN:   [id number]                                                                                     Service Date: 07/20/2024     Accompanied by: unaccompanied   Patient comes today after Dr. Karis, ENT sent a referral for a hearing evaluation due to concerns with right vestibular schwannoma .   Symptoms Yes Details  Hearing loss  [x]  Worse in the right ear . Previous evaluations have been at Dr. [facility]je[facility] clinic.  Tinnitus  []    Ear pain/ infections/pressure  []    Balance problems  []    Noise exposure history  []    Previous ear surgeries  []    Family history of hearing loss  []    Amplification  []    Other  [x]  Reports sound sensitivity and uses a right earplug with a 25 NRR - obtained around 2023 for $125 (also with a solid filter)at Dr. Rojean clinic.    Otoscopy: Right ear: Clear external ear canal and notable landmarks visualized on the tympanic membrane. Left ear:  Clear external ear canal and notable landmarks visualized on the tympanic membrane.  Tympanometry: Right ear: Type A- Normal external ear canal volume with normal middle ear pressure and tympanic membrane compliance. Left ear: Type A- Normal external ear canal volume with normal middle ear pressure and tympanic membrane compliance.   Pure tone Audiometry: Right ear- Normal to profound  sensorineural hearing loss from 125 Hz - 8000 Hz. Left ear-  Normal to moderately severe sensorineural hearing loss from 125 Hz - 8000 Hz. Right asymmetry noted.   Speech Audiometry: Right ear- Speech Reception Threshold (SRT) was obtained at 35 dBHL. Left ear-Speech Reception Threshold (SRT) was obtained at 20 dBHL.   Word Recognition Score Tested using NU-6 (recorded) Right ear: 48% was obtained at a presentation level of 70 dBHL with contralateral masking which is deemed as   poor. Left ear: 100% was obtained at a presentation level of 70 dBHL with contralateral masking which is deemed as  excellent. Right asymmetry noted.   The hearing test results were completed under headphones and results are deemed to be of good to fair reliability. Test technique:  conventional      Recommendations: Follow up with ENT as scheduled for today. Return for a hearing evaluation if concerns with hearing changes arise or per MD recommendation. Recommend going to [facility]'s sound sensitivity clinic as using the plug in his right ear may make him less tolerant to everyday sounds.    Kjersten Ormiston MARIE LEROUX-MARTINEZ, AUD

## 2024-07-22 DIAGNOSIS — H9041 Sensorineural hearing loss, unilateral, right ear, with unrestricted hearing on the contralateral side: Secondary | ICD-10-CM | POA: Insufficient documentation

## 2024-07-22 DIAGNOSIS — H6123 Impacted cerumen, bilateral: Secondary | ICD-10-CM | POA: Insufficient documentation

## 2024-07-22 NOTE — Addendum Note (Signed)
 Addended byBETHA KARIS CLUNES on: 07/22/2024 08:22 AM   Modules accepted: Orders

## 2024-07-23 ENCOUNTER — Telehealth (INDEPENDENT_AMBULATORY_CARE_PROVIDER_SITE_OTHER): Payer: Self-pay | Admitting: Audiology

## 2024-07-23 NOTE — Telephone Encounter (Signed)
 Called the patient as agreed last week. Patient is interested to get a back up right ear plug (with a 25 filter and a solid filter).  The clinics contract is not yet finalized with [employer]. Reviewed with patient  that research suggests that using a plug in the ear can make the person more sensitive to everyday sounds.  Recommended him to call [facility] tinnitus and sound sensitivity clinic to get a second opinion on how to manage the sound sensitivity to routine sounds.  Provided the patient with their phone number. Patient may call if there is any question or needs a referral.

## 2024-07-27 ENCOUNTER — Encounter: Payer: Self-pay | Admitting: Audiology

## 2024-08-22 ENCOUNTER — Encounter (INDEPENDENT_AMBULATORY_CARE_PROVIDER_SITE_OTHER): Payer: Self-pay

## 2024-08-22 ENCOUNTER — Other Ambulatory Visit (HOSPITAL_BASED_OUTPATIENT_CLINIC_OR_DEPARTMENT_OTHER): Payer: Self-pay

## 2024-08-22 ENCOUNTER — Telehealth (INDEPENDENT_AMBULATORY_CARE_PROVIDER_SITE_OTHER): Payer: Self-pay | Admitting: Audiology

## 2024-08-22 NOTE — Telephone Encounter (Signed)
 Called patient and LVM requesting a call back to schedule a Hearing Aid Check Appt in order to pair the hearing aids that were sent out for repair can be paired with the App and checked for no echo.  Also sent MyChart msg.

## 2024-08-27 ENCOUNTER — Other Ambulatory Visit: Payer: Self-pay

## 2024-08-27 ENCOUNTER — Other Ambulatory Visit (HOSPITAL_BASED_OUTPATIENT_CLINIC_OR_DEPARTMENT_OTHER): Payer: Self-pay

## 2024-08-27 MED ORDER — FINASTERIDE 5 MG PO TABS
5.0000 mg | ORAL_TABLET | Freq: Every day | ORAL | 1 refills | Status: AC
  Filled 2024-08-27: qty 100, 100d supply, fill #0
  Filled 2024-12-12: qty 100, 100d supply, fill #1

## 2024-08-28 ENCOUNTER — Other Ambulatory Visit (HOSPITAL_BASED_OUTPATIENT_CLINIC_OR_DEPARTMENT_OTHER): Payer: Self-pay

## 2024-09-04 DIAGNOSIS — H9311 Tinnitus, right ear: Secondary | ICD-10-CM | POA: Diagnosis not present

## 2024-09-04 DIAGNOSIS — H903 Sensorineural hearing loss, bilateral: Secondary | ICD-10-CM | POA: Diagnosis not present

## 2024-09-17 ENCOUNTER — Other Ambulatory Visit (HOSPITAL_BASED_OUTPATIENT_CLINIC_OR_DEPARTMENT_OTHER): Payer: Self-pay

## 2024-09-25 ENCOUNTER — Other Ambulatory Visit (HOSPITAL_BASED_OUTPATIENT_CLINIC_OR_DEPARTMENT_OTHER): Payer: Self-pay

## 2024-09-25 ENCOUNTER — Ambulatory Visit: Admitting: Family Medicine

## 2024-09-25 ENCOUNTER — Encounter: Payer: Self-pay | Admitting: Family Medicine

## 2024-09-25 VITALS — BP 108/68 | HR 77 | Temp 97.7°F | Ht 69.0 in | Wt 172.4 lb

## 2024-09-25 DIAGNOSIS — H9312 Tinnitus, left ear: Secondary | ICD-10-CM | POA: Diagnosis not present

## 2024-09-25 DIAGNOSIS — M65342 Trigger finger, left ring finger: Secondary | ICD-10-CM | POA: Diagnosis not present

## 2024-09-25 DIAGNOSIS — H9311 Tinnitus, right ear: Secondary | ICD-10-CM | POA: Diagnosis not present

## 2024-09-25 DIAGNOSIS — Z23 Encounter for immunization: Secondary | ICD-10-CM | POA: Diagnosis not present

## 2024-09-25 MED ORDER — MELOXICAM 15 MG PO TABS
15.0000 mg | ORAL_TABLET | Freq: Every day | ORAL | 0 refills | Status: AC
  Filled 2024-09-25: qty 30, 30d supply, fill #0

## 2024-09-25 NOTE — Assessment & Plan Note (Signed)
Symptoms are overall stable.

## 2024-09-25 NOTE — Progress Notes (Signed)
   Edward Medina is a 68 y.o. male who presents today for an office visit.  Assessment/Plan:  New/Acute Problems: Left fourth digit trigger finger No red flags.  Discussed treatment options.  He prefers conservative management at this point.  Will start meloxicam .  Also discussed finger splinting and splint was given today.  He will let us  know if not improving in a few weeks and would consider corticosteroid injection at that point.  Left ear tinnitus Normal exam today.  This has been persistent for the last month since loud noise exposure at home.  He will follow-up with the ENT and audiology clinic soon.  Chronic Problems Addressed Today: Tinnitus of right ear Symptoms are overall stable.     Subjective:  HPI:  See assessment / plan for status of chronic conditions.   Discussed the use of AI scribe software for clinical note transcription with the patient, who gave verbal consent to proceed.  History of Present Illness Edward Medina is a 68 year old male who presents with trigger finger in the left hand ring finger.  He has been experiencing symptoms of trigger finger in his left hand ring finger since the beginning of the year. The finger gets stuck in a bent position, particularly worse overnight, and requires manual assistance to straighten. Stiffness improves somewhat in the morning but persists throughout the day. He has been using a splint provided by his cousin since July, but it has not shown significant improvement. He reports only a little pain at times, but the stiffness and locking impact his ability to play the piano.  There is a family history of trigger finger, with a first cousin and his father having the condition.  He reports a recent incident involving a loud noise from a bloated fruit can, which has affected his left ear. Although he does not feel his hearing is impacted, he experiences a change in pressure and hears a consistent tone when the pressure  changes, such as when yawning or closing a car door. This has been ongoing for about a month since the incident in September. He has a history of a condition in his right ear, known as Acoustic neuroma, and is currently under the care of an ENT and the [facility] for tinnitus and ear mold issues.         Objective:  Physical Exam: BP 108/68   Pulse 77   Temp 97.7 F (36.5 C) (Temporal)   Ht 5' 9 (1.753 m)   Wt 172 lb 6.4 oz (78.2 kg)   SpO2 97%   BMI 25.46 kg/m   Gen: No acute distress, resting comfortably HEENT: Left TM clear MUSCULOSKELETAL: Trigger finger in left fourth digit.  Neurovascular intact distally. Neuro: Grossly normal, moves all extremities Psych: Normal affect and thought content      Edward Medina M. Kennyth, MD 09/25/2024 8:10 AM

## 2024-09-25 NOTE — Patient Instructions (Signed)
 It was very nice to see you today!  VISIT SUMMARY: Today, you were seen for trigger finger in your left hand ring finger and barotrauma in your left ear. We discussed treatment options and provided a flu shot.  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 -Take meloxicam  as prescribed for a couple of weeks to reduce inflammation. -Use the finger splint to immobilize the joint, especially overnight and as much as possible during the day. -We will reassess in a few weeks to see if a cortisone injection is needed.  TINNITUS, LEFT EAR: You have tinnitus in your left ear following exposure to a loud noise, causing a persistent tone with pressure changes but no hearing loss. -Continue care with your ENT and the [facility] for ongoing tinnitus management.  GENERAL HEALTH MAINTENANCE: You are due for your flu vaccination. -You received your flu shot during today's visit.  No follow-ups on file.   Take care, Dr Kennyth  PLEASE NOTE:  If you had any lab tests, please let us  know if you have not heard back within a few days. You may see your results on mychart before we have a chance to review them but we will give you a call once they are reviewed by us .   If we ordered any referrals today, please let us  know if you have not heard from their office within the next week.   If you had any urgent prescriptions sent in today, please check with the pharmacy within an hour of our visit to make sure the prescription was transmitted appropriately.   Please try these tips to maintain a healthy lifestyle:  Eat at least 3 REAL meals and 1-2 snacks per day.  Aim for no more than 5 hours between eating.  If you eat breakfast, please do so within one hour of getting up.   Each meal should contain half fruits/vegetables, one quarter protein, and one quarter carbs (no bigger than  a computer mouse)  Cut down on sweet beverages. This includes juice, soda, and sweet tea.   Drink at least 1 glass of water with each meal and aim for at least 8 glasses per day  Exercise at least 150 minutes every week.

## 2024-10-12 ENCOUNTER — Other Ambulatory Visit (HOSPITAL_BASED_OUTPATIENT_CLINIC_OR_DEPARTMENT_OTHER): Payer: Self-pay

## 2024-10-12 MED ORDER — COMIRNATY 30 MCG/0.3ML IM SUSY
0.3000 mL | PREFILLED_SYRINGE | Freq: Once | INTRAMUSCULAR | 0 refills | Status: AC
  Filled 2024-10-12: qty 0.3, 1d supply, fill #0

## 2024-10-23 ENCOUNTER — Encounter: Payer: Self-pay | Admitting: Family Medicine

## 2024-10-23 NOTE — Telephone Encounter (Signed)
 Spoke with patient, aware tape at front office

## 2024-10-26 ENCOUNTER — Telehealth: Payer: Self-pay

## 2024-10-26 NOTE — Telephone Encounter (Signed)
 Copied from CRM [id number]. Topic: Clinical - Medical Advice >> Oct 26, 2024 11:50 AM Thersia BROCKS wrote: Reason for CRM: Patient saw provider for trigger finger a month ago, stated he is done with the prescription and doesnt see a change, so would like to move forward with getting the cortise shot . Would like a response through mychart as he is having issues with cellular service at home

## 2024-10-29 NOTE — Telephone Encounter (Signed)
 Recommend referral to sports medicine.  W[facility] HERO. Kennyth, MD 10/29/2024 8:25 AM

## 2024-10-31 ENCOUNTER — Other Ambulatory Visit: Payer: Self-pay | Admitting: *Deleted

## 2024-10-31 DIAGNOSIS — M65342 Trigger finger, left ring finger: Secondary | ICD-10-CM

## 2024-10-31 NOTE — Telephone Encounter (Signed)
 Referral placed.

## 2024-11-02 ENCOUNTER — Encounter: Payer: Self-pay | Admitting: *Deleted

## 2024-11-02 DIAGNOSIS — Z006 Encounter for examination for normal comparison and control in clinical research program: Secondary | ICD-10-CM

## 2024-11-02 NOTE — Research (Signed)
 Spoke with Mr Laura about pre-event research. He is coming in Nov 18 th at 0830 fro LPa screening.

## 2024-11-06 ENCOUNTER — Encounter: Admitting: *Deleted

## 2024-11-06 DIAGNOSIS — Z006 Encounter for examination for normal comparison and control in clinical research program: Secondary | ICD-10-CM

## 2024-11-06 NOTE — Research (Addendum)
 Edward Medina called and informed of Lpa results. And that he is a screen fail.     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  What is a "normal" Lp(a) level, and when should I be concerned?  How is Lp(a) related to "bad cholesterol?"  Does a high Lp(a) level increase my risk of heart disease?  How are Lp(a) levels inherited?  Do Lp(a) levels vary by race or ethnicity?  Diagnosing High Lp(a)  What are the signs of high Lp(a)?  How to get an Lp(a) test?  What Lp(a) results are considered high? How do I get a diagnosis of elevated lipoprotein(a)?  How can I lower my Lp(a)?     During the return of results coaching session, all the topics listed above were reviewed with the participant as per the documents: Guidance for Clinician-Patient Lp(a) Risk Assessment Discussion African American Heart Study and Micro-Learning Session: High Lipoprotein(a) 101.   Participant verbalized understanding of test results and what to do next: to ask their healthcare practitioner to test Lp(a) level, advise first-degree relatives to be screened, and work to reduce all cardiovascular risk factors within their control, especially LDL cholesterol.    The consent from today is for the LPa screening part of the study.  Calcium  score was 198 on 02-18-2020  Blood was drawn at 0901

## 2024-11-06 NOTE — Research (Signed)
 Pre-event  INCLUSION  Yes No  Participant has provided written informed consent before initiation of any study-specific activities/procedures. [x]  []   Age >= 50 years at the time of signing of the Lp(a) screening ICF. [x]  []   Lp(a) >= 200 nmol/L during Lp(a) screening by a central laboratory using an investigational IVD test. At least 4 weeks of stable and optimized lipid-lowering therapy consistent with regional/local clinical practice guidelines or according to investigator's judgment before Lp(a) screening.                                                                                                         _______________    Participants meeting at least one of the following categories (A or B):  A. Multiple risk factors for atherosclerotic disease  1. Presence of >= 4 of any of the following ASCVD risk factors: a. Age >= 65 years men or women b. History of hypertension requiring pharmacotherapy c. Current cigarette tobacco smoking d. Diabetes mellitus (Type 1 or Type 2) requiring pharmacotherapy e. Screening high-sensitivity C-reactive protein (hs-CRP) >= 2.0 mg/L by central laboratory f. Screening estimated glomerular filtration rate (eGFR) 30 to < 60 mL/min/1.73 m2 by central laboratory g. Familial hypercholesterolemia  or family history of premature ASCVD (1st degree relative before age 80 years for males, and/or 1st degree relative before age 74 years for females)  AND/OR  B. History of atherosclerosis evidenced by:  1. Coronary Artery Disease-Reporting and Data System (CAD-RADS) P3, and/or 2. Coronary artery calcium  (CAC) > 300, and/or                          CAC 198 3. Atherosclerosis defined by at least one of the following AND >= 2 additional risk factors listed in 104 A: a. Coronary atherosclerosis as evidenced by a stenosis >= 50% in at least one artery b. Coronary atherosclerosis as evidenced by a stenosis >= 25% (or reported as at least mild) in at least 2  arteries detected on invasive or noninvasive imaging c. Coronary atherosclerosis as evidenced by a CAC score > 100 and/or CAD-RADS >= P2 d. Carotid atherosclerosis as evidenced by an internal carotid stenosis >= 50% e. Peripheral atherosclerosis as evidenced by >= 50% stenosis of limb artery and/or ankle brachial index < 0.9                                                                     Bold the above patient meets.  [x]  []   EXCLUSION Yes No  Prior acute atherothrombotic qualifying event at any time, defined as prior myocardial infarction, prior stroke, prior transient ischemic attack, or prior acute limb ischemia. []  [x]   Prior arterial revascularization at any time suspected to be associated with atherosclerosis. []  [x]   If available, participants without known atherosclerosis, CAC  score = 0, CAD-RADS = 0, in the last 10 years prior to enrollment. []  [x]   Severe renal dysfunction, defined as an eGFR < 30 mL/min/1.73 m2 by central laboratory during screening []  [x]   History of decompensated liver cirrhosis and/or screening aspartate aminotransferase (AST) or alanine aminotransferase (ALT) > 3 x upper limit of normal (ULN), or total bilirubin (TBL) > 2 x ULN (except in stable Gilbert's syndrome). []  [x]   History of major bleeding disorder (eg, hemophilia, von Willebrand disease, clotting factor deficiencies, etc). []  [x]   Planned arterial revascularization (percutaneous or surgical). []  [x]   Fasting triglycerides > 400 mg/dL (5.47 mmol/L) during screening. []  [x]   Participant has known sensitivity to any of the products or components to be administered during dosing or the study procedures. []  [x]   Malignancy not in remission for at least 5 years prior to enrollment, exceptions for less than 5 years since remission include non-melanoma skin cancers, localized thyroid  cancer (papillary, follicular, and medullary), cervical in situ carcinoma, breast ductal carcinoma in situ, or stage 1  prostate carcinoma. []  [x]   Diagnosis of severe heart failure ([place]  Heart Association Functional Classification IV), and/or if available, most recent left ventricular ejection fraction < 30%. []  [x]   Uncontrolled or recurrent ventricular tachycardia in the past 3 months before enrollment. []  [x]   Atrial fibrillation or flutter and not on an anticoagulant despite an indication to be anticoagulated.    Uncontrolled hypertension at screening, defined as systolic blood pressure > 180 mmHg or diastolic blood pressure > 110 mmHg at rest despite antihypertensive therapy. []  [x]   Diabetes mellitus (Type 1 or Type 2) with a hemoglobin A1C (HbA1c) >= 10% by central laboratory at screening. []  [x]   History or evidence of any other clinically significant disorder, condition, or disease (such as active infection) that, in the opinion of the investigator or [employer] physician, if consulted, would pose a risk to participant safety, or interfere with the study evaluation, procedures or completion. []  [x]   Current or planned lipoprotein apheresis or < 3 months since last apheresis treatment before enrollment. []  [x]   Participant has taken a cholesteryl ester transfer protein inhibitor or lomitapide in the last 12 months before enrollment. []  [x]   Previously received any therapy specifically targeting Lp(a) including but not limited to, olpasiran, pelacarsen, lepodisiran, zerlasiran, or muvalaplin []  [x]    Currently receiving treatment in another investigational device or drug study, or less than 30 days since ending treatment on another investigational device or drug study(ies). Other investigational procedures while participating in this study are excluded. []  [x]   Participants of childbearing potential unwilling to use protocol-specified method of contraception during treatment and for an additional 30 days after the last dose of investigational product.  []  [x]   Participants who are breastfeeding or who  plan to breastfeed while on study through 30 days after the last dose of investigational product []  [x]   Participants planning to become pregnant while on study through 30 days after the last dose of investigational product.  []  [x]   Participants of childbearing potential with a positive pregnancy test assessed at screening and/or day 1 by a highly sensitive urine or serum pregnancy test. []  [x]   Participant likely to not be available to complete all protocol-required study visits or procedures, and/or to comply with all required study procedures (eg, Clinical Outcome Assessments) to the best of the Participant and investigator's knowledge.                         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  What is a "normal" Lp(a) level, and when should I be concerned?  How is Lp(a) related to "bad cholesterol?"  Does a high Lp(a) level increase my risk of heart disease?  How are Lp(a) levels inherited?  Do Lp(a) levels vary by race or ethnicity?  Diagnosing High Lp(a)  What are the signs of high Lp(a)?  How to get an Lp(a) test?  What Lp(a) results are considered high? How do I get a diagnosis of elevated lipoprotein(a)?  How can I lower my Lp(a)?    During the return of results coaching session, all the topics listed above were reviewed with the participant as per the documents: Guidance for  Clinician-Patient Lp(a) Risk Assessment Discussion African American Heart Study and Micro-Learning Session: High Lipoprotein(a) 101.  Participant verbalized understanding of test results and what to do next: to ask their healthcare practitioner to test Lp(a) level, advise first-degree relatives to be screened, and work to reduce all cardiovascular risk factors within their control, especially LDL cholesterol.      Subject Name: Edward Medina  Subject met inclusion and exclusion criteria.  The informed consent form, study requirements and expectations were reviewed with the subject and questions and concerns were addressed prior to the signing of the consent form.  The subject verbalized understanding of the trial requirements.  The subject agreed to participate in the pre-event  trial and signed the informed consent at 0850 on 06-Nov-2024.  The informed consent was obtained prior to performance of any protocol-specific procedures for the subject.  A copy of the signed informed consent was given to the subject and a copy was placed in the subject's medical record.   Aquil Duhe, [place]   Mr Ferrall is here for LPa screening visit. He was given time to read the consent in a quiet setting and given time to ask questions, He reports feeling good today. Plans a trip from Nov 26-Dec 23. Informed him I will call him with the results of the test.  He reports he has not been to the ED or Urgent care recently. No C/o pain. Reviewed meds with him. Voices understanding.    Current Outpatient Medications:    aluminum chloride (DRYSOL) 20 % external solution, Apply topically at bedtime., Disp: , Rfl:    aspirin 81 MG tablet, Take 81 mg by mouth daily., Disp: , Rfl:    Cholecalciferol (VITAMIN D3) 50 MCG (2000 UT) TABS, Take 1 tablet by mouth daily. , Disp: , Rfl:    COLLAGEN PO, Adds as a daily supplement to coffee, Disp: , Rfl:    Evolocumab  (REPATHA  SURECLICK) 140 MG/ML SOAJ, Inject 140 mg into the skin  every 14 (fourteen) days., Disp: 6 mL, Rfl: 3   finasteride  (PROSCAR ) 5 MG tablet, Take 1 tablet (5 mg total) by mouth daily., Disp: 100  tablet, Rfl: 1   fish oil-omega-3 fatty acids 1000 MG capsule, Take 2 g by mouth daily. , Disp: , Rfl:    Multiple Vitamin (MULTI VITAMIN MENS PO), Take by mouth daily., Disp: , Rfl:    terazosin (HYTRIN) 10 MG capsule, Take 5 mg by mouth every other day., Disp: , Rfl:    finasteride  (PROSCAR ) 5 MG tablet, Take 5 mg by mouth daily., Disp: , Rfl:    meloxicam  (MOBIC ) 15 MG tablet, Take 1 tablet (15 mg total) by mouth daily. (Patient not taking: Reported on 11/06/2024), Disp: 30 tablet, Rfl: 0

## 2024-11-09 ENCOUNTER — Other Ambulatory Visit (HOSPITAL_BASED_OUTPATIENT_CLINIC_OR_DEPARTMENT_OTHER): Payer: Self-pay

## 2024-11-09 ENCOUNTER — Encounter (HOSPITAL_BASED_OUTPATIENT_CLINIC_OR_DEPARTMENT_OTHER): Payer: Self-pay | Admitting: Cardiovascular Disease

## 2024-11-12 ENCOUNTER — Other Ambulatory Visit (HOSPITAL_BASED_OUTPATIENT_CLINIC_OR_DEPARTMENT_OTHER): Payer: Self-pay

## 2024-11-12 ENCOUNTER — Ambulatory Visit: Admitting: Family Medicine

## 2024-12-12 ENCOUNTER — Other Ambulatory Visit (HOSPITAL_BASED_OUTPATIENT_CLINIC_OR_DEPARTMENT_OTHER): Payer: Self-pay

## 2024-12-14 ENCOUNTER — Ambulatory Visit: Admitting: Family Medicine

## 2024-12-17 ENCOUNTER — Encounter: Payer: Self-pay | Admitting: *Deleted

## 2024-12-17 DIAGNOSIS — Z006 Encounter for examination for normal comparison and control in clinical research program: Secondary | ICD-10-CM

## 2024-12-17 NOTE — Research (Signed)
 Message left for [name] to come by and pick up the clincard.

## 2024-12-21 ENCOUNTER — Encounter: Payer: Self-pay | Admitting: Family Medicine

## 2024-12-21 ENCOUNTER — Other Ambulatory Visit: Payer: Self-pay

## 2024-12-21 ENCOUNTER — Ambulatory Visit: Admitting: Family Medicine

## 2024-12-21 VITALS — BP 108/80 | HR 71 | Ht 69.0 in | Wt 171.0 lb

## 2024-12-21 DIAGNOSIS — M65342 Trigger finger, left ring finger: Secondary | ICD-10-CM

## 2024-12-21 NOTE — Progress Notes (Signed)
"       ° °  I, Leotis Batter, CMA acting as a scribe for Artist Lloyd, MD.  Edward Medina is a 69 y.o. male who presents to [facility] today for L hand pain x 7-8 months. Pt locates pain to the left 4th digit. Has tried Meloxicam  and splinting with no improvement. Primarily RHD but occasionally uses the left hand.   Treatments tried: Meloxicam , splinting  Pertinent review of systems: No fevers or chills  Relevant historical information: Coronary artery disease   Exam:  BP 108/80   Pulse 71   Ht 5' 9 (1.753 m)   Wt 171 lb (77.6 kg)   SpO2 98%   BMI 25.25 kg/m  General: Well Developed, well nourished, and in no acute distress.   MSK: Left hand normal appearing Triggering present with flexion of fourth PIP.  Tender palpation palmar fourth MCP.    Lab and Radiology Results  Procedure: Real-time Ultrasound Guided Injection of left fourth MCP tendon sheath at A1 pulley.  Trigger finger injection Device: Philips Affiniti 50G/GE Logiq Images permanently stored and available for review in PACS Verbal informed consent obtained.  Discussed risks and benefits of procedure. Warned about infection, bleeding, hyperglycemia damage to structures among others. Patient expresses understanding and agreement Time-out conducted.   Noted no overlying erythema, induration, or other signs of local infection.   Skin prepped in a sterile fashion.   Local anesthesia: Topical Ethyl chloride.   With sterile technique and under real time ultrasound guidance: 40 mg of Kenalog and 1 mL of lidocaine  injected into A1 pulley tendon sheath. Fluid seen entering the tendon sheath.   Completed without difficulty   Pain immediately resolved suggesting accurate placement of the medication.   Advised to call if fevers/chills, erythema, induration, drainage, or persistent bleeding.   Images permanently stored and available for review in the ultrasound unit.  Impression: Technically  successful ultrasound guided injection.      Assessment and Plan: 69 y.o. male with left hand trigger finger.  Plan for injection today.  Will also proceed with double Band-Aid splint.  Communicate back with my office especially if not improved.   PDMP not reviewed this encounter. Orders Placed This Encounter  Procedures   US  LIMITED JOINT SPACE STRUCTURES UP LEFT(NO LINKED CHARGES)    Reason for Exam (SYMPTOM  OR DIAGNOSIS REQUIRED):   left trigger finger    Preferred imaging location?:   [facility]   No orders of the defined types were placed in this encounter.    Discussed warning signs or symptoms. Please see discharge instructions. Patient expresses understanding.   The above documentation has been reviewed and is accurate and complete Artist Lloyd, M.D.   "

## 2024-12-21 NOTE — Patient Instructions (Addendum)
 Thank you for coming in today.   You received an injection today. Seek immediate medical attention if the joint becomes red, extremely painful, or is oozing fluid.   Instructed on Double Band-Aid Splint.   See you back as needed.

## 2025-06-13 ENCOUNTER — Encounter: Admitting: Family Medicine
